# Patient Record
Sex: Female | Born: 1990 | Race: White | Hispanic: No | Marital: Married | Smoking: Never smoker
Health system: Southern US, Community
[De-identification: ages and names within clinical notes are randomized; demographics above are authoritative.]

## PROBLEM LIST (undated history)

## (undated) ENCOUNTER — Inpatient Hospital Stay (HOSPITAL_COMMUNITY): Payer: Self-pay

## (undated) DIAGNOSIS — Z789 Other specified health status: Secondary | ICD-10-CM

## (undated) HISTORY — PX: DILATION AND CURETTAGE OF UTERUS: SHX78

## (undated) HISTORY — PX: TONSILLECTOMY: SUR1361

---

## 2012-05-11 ENCOUNTER — Emergency Department (HOSPITAL_COMMUNITY): Payer: BC Managed Care – PPO

## 2012-05-11 ENCOUNTER — Emergency Department (HOSPITAL_COMMUNITY)
Admission: EM | Admit: 2012-05-11 | Discharge: 2012-05-11 | Disposition: A | Discharge status: Home / Self Care | Payer: BC Managed Care – PPO | Attending: Emergency Medicine | Admitting: Emergency Medicine

## 2012-05-11 ENCOUNTER — Encounter (HOSPITAL_COMMUNITY): Payer: Self-pay | Admitting: Emergency Medicine

## 2012-05-11 DIAGNOSIS — N760 Acute vaginitis: Secondary | ICD-10-CM | POA: Insufficient documentation

## 2012-05-11 DIAGNOSIS — O2390 Unspecified genitourinary tract infection in pregnancy, unspecified trimester: Secondary | ICD-10-CM | POA: Insufficient documentation

## 2012-05-11 DIAGNOSIS — A499 Bacterial infection, unspecified: Secondary | ICD-10-CM | POA: Insufficient documentation

## 2012-05-11 DIAGNOSIS — B9689 Other specified bacterial agents as the cause of diseases classified elsewhere: Secondary | ICD-10-CM | POA: Insufficient documentation

## 2012-05-11 DIAGNOSIS — IMO0002 Reserved for concepts with insufficient information to code with codable children: Secondary | ICD-10-CM

## 2012-05-11 LAB — URINE MICROSCOPIC-ADD ON

## 2012-05-11 LAB — URINALYSIS, ROUTINE W REFLEX MICROSCOPIC
Nitrite: NEGATIVE
Specific Gravity, Urine: 1.033 — ABNORMAL HIGH (ref 1.005–1.030)
Urobilinogen, UA: 0.2 mg/dL (ref 0.0–1.0)

## 2012-05-11 LAB — WET PREP, GENITAL: Trich, Wet Prep: NONE SEEN

## 2012-05-11 LAB — POCT PREGNANCY, URINE: Preg Test, Ur: POSITIVE — AB

## 2012-05-11 MED ORDER — METRONIDAZOLE 500 MG PO TABS: 500.0000 mg | ORAL_TABLET | Freq: Two times a day (BID) | ORAL | Status: AC

## 2012-05-11 NOTE — ED Provider Notes (Signed)
History     CSN: [id number]  Arrival date & time 05/11/12  1730   First MD Initiated Contact with Patient 05/11/12 1813      Chief Complaint  Patient presents with  . Vaginal Bleeding    (Consider location/radiation/quality/duration/timing/severity/associated sxs/prior treatment) Patient is a 21 y.o. female presenting with vaginal bleeding. The history is provided by the patient.  Vaginal Bleeding   patient with vaginal spotting x3 days. She is currently [redacted] weeks pregnant which is been confirmed by an ultrasound according to her. Denies any abdominal cramping or pain. No urinary symptoms. States that she is using one panty liner. No current problems with this pregnancy. She is a G1 P0. [facility] and sent here for further evaluation  History reviewed. No pertinent past medical history.  Past Surgical History  Procedure Date  . Tonsillectomy     No family history on file.  History  Substance Use Topics  . Smoking status: Never Smoker   . Smokeless tobacco: Not on file  . Alcohol Use: No    OB History    Grav Para Term Preterm Abortions TAB SAB Ect Mult Living   1               Review of Systems  Genitourinary: Positive for vaginal bleeding.  All other systems reviewed and are negative.    Allergies  Review of patient's allergies indicates no known allergies.  Home Medications   Current Outpatient Rx  Name Route Sig Dispense Refill  . PRENATAL MULTIVITAMIN CH Oral Take 1 tablet by mouth daily.      BP 121/72  Pulse 90  Temp(Src) 98.8 F (37.1 C) (Oral)  Resp 20  SpO2 100%  Physical Exam  Nursing note and vitals reviewed. Constitutional: She is oriented to person, place, and time. She appears well-developed and well-nourished.  Non-toxic appearance. No distress.  HENT:  Head: Normocephalic and atraumatic.  Eyes: Conjunctivae, EOM and lids are normal. Pupils are equal, round, and reactive to light.  Neck: Normal range of motion. Neck supple.  No tracheal deviation present. No mass present.  Cardiovascular: Normal rate, regular rhythm and normal heart sounds.  Exam reveals no gallop.   No murmur heard. Pulmonary/Chest: Effort normal and breath sounds normal. No stridor. No respiratory distress. She has no decreased breath sounds. She has no wheezes. She has no rhonchi. She has no rales.  Abdominal: Soft. Normal appearance and bowel sounds are normal. She exhibits no distension. There is no tenderness. There is no rebound and no CVA tenderness.  Genitourinary:       Cervical os closed, some bloody mucus  Musculoskeletal: Normal range of motion. She exhibits no edema and no tenderness.  Neurological: She is alert and oriented to person, place, and time. She has normal strength. No cranial nerve deficit or sensory deficit. GCS eye subscore is 4. GCS verbal subscore is 5. GCS motor subscore is 6.  Skin: Skin is warm and dry. No abrasion and no rash noted.  Psychiatric: She has a normal mood and affect. Her speech is normal and behavior is normal.    ED Course  Procedures (including critical care time)  Labs Reviewed  POCT PREGNANCY, URINE - Abnormal; Notable for the following:    Preg Test, Ur POSITIVE (*)    All other components within normal limits  URINALYSIS, ROUTINE W REFLEX MICROSCOPIC   No results found.   No diagnosis found.    MDM  Spoke with patient's OB doctor,  Dr. Gigi Gin, she'll be seen in the office tomorrow concerning for fetal demise. She also be treated for bacterial vaginosis        Toy Baker, MD 05/11/12 2122

## 2012-05-11 NOTE — Discharge Instructions (Signed)
Bacterial Vaginosis Bacterial vaginosis (BV) is a vaginal infection where the normal balance of bacteria in the vagina is disrupted. The normal balance is then replaced by an overgrowth of certain bacteria. There are several different kinds of bacteria that can cause BV. BV is the most common vaginal infection in women of childbearing age. CAUSES   The cause of BV is not fully understood. BV develops when there is an increase or imbalance of harmful bacteria.   Some activities or behaviors can upset the normal balance of bacteria in the vagina and put women at increased risk including:   Having a new sex partner or multiple sex partners.   Douching.   Using an intrauterine device (IUD) for contraception.   It is not clear what role sexual activity plays in the development of BV. However, women that have never had sexual intercourse are rarely infected with BV.  Women do not get BV from toilet seats, bedding, swimming pools or from touching objects around them.  SYMPTOMS   Grey vaginal discharge.   A fish-like odor with discharge, especially after sexual intercourse.   Itching or burning of the vagina and vulva.   Burning or pain with urination.   Some women have no signs or symptoms at all.  DIAGNOSIS  Your caregiver must examine the vagina for signs of BV. Your caregiver will perform lab tests and look at the sample of vaginal fluid through a microscope. They will look for bacteria and abnormal cells (clue cells), a pH test higher than 4.5, and a positive amine test all associated with BV.  RISKS AND COMPLICATIONS   Pelvic inflammatory disease (PID).   Infections following gynecology surgery.   Developing HIV.   Developing herpes virus.  TREATMENT  Sometimes BV will clear up without treatment. However, all women with symptoms of BV should be treated to avoid complications, especially if gynecology surgery is planned. Female partners generally do not need to be treated. However,  BV may spread between female sex partners so treatment is helpful in preventing a recurrence of BV.   BV may be treated with antibiotics. The antibiotics come in either pill or vaginal cream forms. Either can be used with nonpregnant or pregnant women, but the recommended dosages differ. These antibiotics are not harmful to the baby.   BV can recur after treatment. If this happens, a second round of antibiotics will often be prescribed.   Treatment is important for pregnant women. If not treated, BV can cause a premature delivery, especially for a pregnant woman who had a premature birth in the past. All pregnant women who have symptoms of BV should be checked and treated.   For chronic reoccurrence of BV, treatment with a type of prescribed gel vaginally twice a week is helpful.  HOME CARE INSTRUCTIONS   Finish all medication as directed by your caregiver.   Do not have sex until treatment is completed.   Tell your sexual partner that you have a vaginal infection. They should see their caregiver and be treated if they have problems, such as a mild rash or itching.   Practice safe sex. Use condoms. Only have 1 sex partner.  PREVENTION  Basic prevention steps can help reduce the risk of upsetting the natural balance of bacteria in the vagina and developing BV:  Do not have sexual intercourse (be abstinent).   Do not douche.   Use all of the medicine prescribed for treatment of BV, even if the signs and symptoms go away.     Tell your sex partner if you have BV. That way, they can be treated, if needed, to prevent reoccurrence.  SEEK MEDICAL CARE IF:   Your symptoms are not improving after 3 days of treatment.   You have increased discharge, pain, or fever.  MAKE SURE YOU:   Understand these instructions.   Will watch your condition.   Will get help right away if you are not doing well or get worse.  FOR MORE INFORMATION  Division of STD Prevention (DSTDP), Centers for Disease  Control and Prevention: SolutionApps.co.za American Social Health Association (ASHA): www.ashastd.org  Document Released: 11/26/2005 Document Revised: 11/15/2011 Document Reviewed: 05/19/2009 [facility] Patient Information 2012 [place], [place].Miscarriage An early pregnancy loss or spontaneous abortion (miscarriage) is a common problem. This usually happens when the pregnancy is not developing normally. It is very unlikely that you or your partner did anything to cause this, although cigarette smoking, a sexually transmitted disease, excessive alcohol use, or drug abuse can increase the risk. Other causes are:  Abnormalities of the uterus.   Hormone or medical problems.   Trauma or genetic (chromosome) problems.  Having a miscarriage does not change your chances of having a normal pregnancy in the future. Your caregiver will advise you when it is safe to try to get pregnant again. AFTER A MISCARRIAGE  A miscarriage is inevitable when there is continual, heavy vaginal bleeding; cramping; dilation of the cervix; or passing of any pregnancy tissue. Bleeding and cramping will usually continue until all the tissue has been removed from the womb (uterus).   Often the uterus does not clean itself out completely. A medication or a D&C procedure is needed to loosen or remove the pregnancy tissue from the uterus. A D&C scrapes or suctions the tissue out.   If you are RH negative, you may need to have Rh immune globulin to avoid Rh problems.   You may be given medication to fight an infection if the miscarriage was due to an infection.  HOME CARE INSTRUCTIONS   You should rest in bed for the next 2 to 3 days.   Do not take tub baths or put anything in your vagina, including tampons or a douche.   Do not have sex until your caregiver approves.   Avoid exercise or heavy activities until directed by your caregiver.   Save any vaginal discharge that looks like tissue. Ask your caregiver if he or she wants  to inspect the discharge.   If you and your partner are having problems with guilt or grieving, talk to your caregiver or get counseling to help you understand and cope with your pregnancy loss.   Allow enough time to grieve before trying to get pregnant again.  SEEK IMMEDIATE MEDICAL CARE IF:   You have persistent heavy bleeding or a bad smelling vaginal discharge.   You have continued abdominal or pelvic pain.   You have an oral temperature above 102 F (38.9 C), not controlled by medicine.   You have severe weakness, fainting, or keep throwing up (vomiting).   You develop chills.   You are experiencing domestic violence.  MAKE SURE YOU:   Understand these instructions.   Will watch your condition.   Will get help right away if you are not doing well or get worse.  Document Released: 01/03/2005 Document Revised: 11/15/2011 Document Reviewed: 11/18/2008 [facility] Patient Information 2012 [place], [place].  Followup with your [facility] doctor tomorrow. Call the office in the morning

## 2012-05-11 NOTE — ED Notes (Signed)
Pt presenting to ed with c/o seen at [facility] for vaginal spotting and was told to present to ed. Pt states she is [redacted] weeks pregnant. Pt denies lightheadedness and dizziness at this time. Pt is alert and oriented at this time. Pt states onset x 2 days.

## 2012-05-11 NOTE — ED Notes (Signed)
MD at bedside. 

## 2012-12-10 NOTE — L&D Delivery Note (Signed)
Pt had an amniotomy after admission. Given the amt of fluid, it is unlikely that she had SROM on admission. She had pit aug started. She quickly progressed along a normal labor curve. Second stage consisted of 6-7 pushes. She had a SVD of one live viable baby over intact perineum. Placenta-S/I. Left labial tear closed with 3-0 chromic. EBL-400cc. Baby to NBN.

## 2013-03-25 LAB — OB RESULTS CONSOLE GC/CHLAMYDIA: Chlamydia: NEGATIVE

## 2013-05-06 LAB — OB RESULTS CONSOLE VARICELLA ZOSTER ANTIBODY, IGG: Varicella: IMMUNE

## 2013-05-06 LAB — OB RESULTS CONSOLE HEPATITIS B SURFACE ANTIGEN: Hepatitis B Surface Ag: NEGATIVE

## 2013-05-06 LAB — OB RESULTS CONSOLE RUBELLA ANTIBODY, IGM: Rubella: IMMUNE

## 2013-05-12 LAB — OB RESULTS CONSOLE HGB/HCT, BLOOD
HCT: 41 %
Hemoglobin: 13 g/dL

## 2013-05-12 LAB — OB RESULTS CONSOLE ANTIBODY SCREEN: Antibody Screen: NEGATIVE

## 2013-05-12 LAB — OB RESULTS CONSOLE ABO/RH

## 2013-10-21 LAB — OB RESULTS CONSOLE GC/CHLAMYDIA: Gonorrhea: NEGATIVE

## 2013-11-03 ENCOUNTER — Inpatient Hospital Stay (HOSPITAL_COMMUNITY)
Admission: AD | Admit: 2013-11-03 | Discharge: 2013-11-03 | Disposition: A | Discharge status: Home / Self Care | Payer: 59 | Source: Ambulatory Visit | Attending: Obstetrics and Gynecology | Admitting: Obstetrics and Gynecology

## 2013-11-03 ENCOUNTER — Encounter (HOSPITAL_COMMUNITY): Payer: Self-pay

## 2013-11-03 DIAGNOSIS — O479 False labor, unspecified: Secondary | ICD-10-CM | POA: Insufficient documentation

## 2013-11-03 NOTE — MAU Note (Signed)
Patient was seen in the office today and fetal heart rate 108-110. Sent to MAU for NST. Patient states she is having irregular contractions. Denies bleeding or leaking. Was 2cm in the office today. Reports good fetal movement.

## 2013-11-04 ENCOUNTER — Inpatient Hospital Stay (HOSPITAL_COMMUNITY)
Admission: AD | Admit: 2013-11-04 | Discharge: 2013-11-05 | Disposition: A | Discharge status: Home / Self Care | Payer: 59 | Source: Ambulatory Visit | Attending: Obstetrics and Gynecology | Admitting: Obstetrics and Gynecology

## 2013-11-04 ENCOUNTER — Encounter (HOSPITAL_COMMUNITY): Payer: Self-pay | Admitting: *Deleted

## 2013-11-04 DIAGNOSIS — O479 False labor, unspecified: Secondary | ICD-10-CM | POA: Insufficient documentation

## 2013-11-04 NOTE — MAU Note (Signed)
Pt. Here for contractions that began tonight at 8pm. Contracting every 4 mins. Denies any leakage of fluid, was spotting earlier today. Did have some mucous discharge earlier as well. Baby is moving well per pt. Did have an appointment yesterday and was 2 cm at that time. Next appointment is next Wednesday.

## 2013-11-04 NOTE — MAU Note (Signed)
Contractions tonight. Denies VB or LOF. Positive fetal movement. Denies complications with pregnancy. Was dilated 2 cm yesterday.

## 2013-11-06 ENCOUNTER — Inpatient Hospital Stay (HOSPITAL_COMMUNITY)
Admission: AD | Admit: 2013-11-06 | Discharge: 2013-11-08 | DRG: 775 | Disposition: A | Discharge status: Home / Self Care | Payer: 59 | Source: Ambulatory Visit | Attending: Obstetrics and Gynecology | Admitting: Obstetrics and Gynecology

## 2013-11-06 ENCOUNTER — Encounter (HOSPITAL_COMMUNITY): Payer: Self-pay | Admitting: *Deleted

## 2013-11-06 DIAGNOSIS — O4290 Premature rupture of membranes, unspecified as to length of time between rupture and onset of labor, unspecified weeks of gestation: Principal | ICD-10-CM | POA: Diagnosis present

## 2013-11-06 DIAGNOSIS — Z3480 Encounter for supervision of other normal pregnancy, unspecified trimester: Secondary | ICD-10-CM

## 2013-11-06 DIAGNOSIS — E669 Obesity, unspecified: Secondary | ICD-10-CM | POA: Diagnosis present

## 2013-11-06 DIAGNOSIS — IMO0001 Reserved for inherently not codable concepts without codable children: Secondary | ICD-10-CM

## 2013-11-06 HISTORY — DX: Other specified health status: Z78.9

## 2013-11-06 LAB — CBC
HCT: 38.4 % (ref 36.0–46.0)
Hemoglobin: 13.1 g/dL (ref 12.0–15.0)
MCH: 29.2 pg (ref 26.0–34.0)
MCHC: 34.1 g/dL (ref 30.0–36.0)
MCV: 85.7 fL (ref 78.0–100.0)
RBC: 4.48 MIL/uL (ref 3.87–5.11)

## 2013-11-06 MED ORDER — PHENYLEPHRINE 40 MCG/ML (10ML) SYRINGE FOR IV PUSH (FOR BLOOD PRESSURE SUPPORT)
80.0000 ug | PREFILLED_SYRINGE | INTRAVENOUS | Status: DC | PRN
  Filled 2013-11-06: qty 2
  Filled 2013-11-06: qty 10

## 2013-11-06 MED ORDER — EPHEDRINE 5 MG/ML INJ
10.0000 mg | INTRAVENOUS | Status: DC | PRN
  Filled 2013-11-06: qty 2

## 2013-11-06 MED ORDER — PHENYLEPHRINE 40 MCG/ML (10ML) SYRINGE FOR IV PUSH (FOR BLOOD PRESSURE SUPPORT)
80.0000 ug | PREFILLED_SYRINGE | INTRAVENOUS | Status: DC | PRN
  Filled 2013-11-06: qty 10
  Filled 2013-11-06: qty 2

## 2013-11-06 MED ORDER — BUTORPHANOL TARTRATE 1 MG/ML IJ SOLN
1.0000 mg | INTRAMUSCULAR | Status: DC | PRN
  Administered 2013-11-07: 1 mg via INTRAVENOUS
  Filled 2013-11-06: qty 1

## 2013-11-06 MED ORDER — IBUPROFEN 600 MG PO TABS: 600.0000 mg | ORAL_TABLET | Freq: Four times a day (QID) | ORAL | Status: DC | PRN

## 2013-11-06 MED ORDER — DIPHENHYDRAMINE HCL 50 MG/ML IJ SOLN: 12.5000 mg | INTRAMUSCULAR | Status: DC | PRN

## 2013-11-06 MED ORDER — TERBUTALINE SULFATE 1 MG/ML IJ SOLN: 0.2500 mg | Freq: Once | INTRAMUSCULAR | Status: AC | PRN

## 2013-11-06 MED ORDER — CITRIC ACID-SODIUM CITRATE 334-500 MG/5ML PO SOLN: 30.0000 mL | ORAL | Status: DC | PRN

## 2013-11-06 MED ORDER — LACTATED RINGERS IV SOLN
INTRAVENOUS | Status: DC
  Administered 2013-11-06 – 2013-11-07 (×3): via INTRAVENOUS

## 2013-11-06 MED ORDER — OXYTOCIN BOLUS FROM INFUSION
500.0000 mL | INTRAVENOUS | Status: DC
  Administered 2013-11-07: 500 mL via INTRAVENOUS

## 2013-11-06 MED ORDER — LIDOCAINE HCL (PF) 1 % IJ SOLN
30.0000 mL | INTRAMUSCULAR | Status: DC | PRN
  Administered 2013-11-07: 30 mL via SUBCUTANEOUS
  Filled 2013-11-06 (×2): qty 30

## 2013-11-06 MED ORDER — OXYTOCIN 40 UNITS IN LACTATED RINGERS INFUSION - SIMPLE MED
62.5000 mL/h | INTRAVENOUS | Status: DC
  Filled 2013-11-06: qty 1000

## 2013-11-06 MED ORDER — OXYCODONE-ACETAMINOPHEN 5-325 MG PO TABS: 1.0000 | ORAL_TABLET | ORAL | Status: DC | PRN

## 2013-11-06 MED ORDER — ONDANSETRON HCL 4 MG/2ML IJ SOLN: 4.0000 mg | Freq: Four times a day (QID) | INTRAMUSCULAR | Status: DC | PRN

## 2013-11-06 MED ORDER — ACETAMINOPHEN 325 MG PO TABS: 650.0000 mg | ORAL_TABLET | ORAL | Status: DC | PRN

## 2013-11-06 MED ORDER — LACTATED RINGERS IV SOLN: 500.0000 mL | INTRAVENOUS | Status: DC | PRN

## 2013-11-06 MED ORDER — EPHEDRINE 5 MG/ML INJ
10.0000 mg | INTRAVENOUS | Status: DC | PRN
  Filled 2013-11-06: qty 4
  Filled 2013-11-06: qty 2

## 2013-11-06 MED ORDER — LACTATED RINGERS IV SOLN
500.0000 mL | Freq: Once | INTRAVENOUS | Status: AC
  Administered 2013-11-07: 1000 mL via INTRAVENOUS

## 2013-11-06 MED ORDER — OXYTOCIN 40 UNITS IN LACTATED RINGERS INFUSION - SIMPLE MED
1.0000 m[IU]/min | INTRAVENOUS | Status: DC
  Administered 2013-11-06: 2 m[IU]/min via INTRAVENOUS

## 2013-11-06 MED ORDER — FLEET ENEMA 7-19 GM/118ML RE ENEM: 1.0000 | ENEMA | RECTAL | Status: DC | PRN

## 2013-11-06 MED ORDER — FENTANYL 2.5 MCG/ML BUPIVACAINE 1/10 % EPIDURAL INFUSION (WH - ANES)
14.0000 mL/h | INTRAMUSCULAR | Status: DC | PRN
  Administered 2013-11-07: 14 mL/h via EPIDURAL
  Filled 2013-11-06: qty 125

## 2013-11-06 NOTE — H&P (Signed)
Pt is a 22 yr old white female G2P0 at term who presents to the ER with SROM. PNC was complicated by morbid obesity.  PMHX: see hollister PE: VSSAF         HEENT-wnl         ABD-gravid         Pelvic- Cx-70/2/-3 vtx IMP/ IUP at term with SROM Plan/ Admit.

## 2013-11-06 NOTE — MAU Note (Signed)
Pt reports she noticed vaginal bleeding yesterday. It stopped and restarted today. Reports occasional mild ctx and good fetal movement.

## 2013-11-06 NOTE — Progress Notes (Signed)
Dareen Piano, MD, notified of SVE, UC activity and SROM at 0100 verified by positive amnisure. Orders received for 1 mg stadol every 2 hours prn, epidural, and continue to monitor at this time. POC to be reevaluated at 2200 and pitocin may be started.

## 2013-11-06 NOTE — MAU Provider Note (Signed)
Chief Complaint:  Vaginal Bleeding   First Provider Initiated Contact with Patient 11/06/13 1645     HPI: Deanna Avila is a 22 y.o. G1P0 at [redacted]w[redacted]d who presents to maternity admissions reporting leaking  And mild contractions. Denies vaginal bleeding. Good fetal movement.    Past Medical History: Past Medical History  Diagnosis Date  . Medical history non-contributory     Past obstetric history: OB History  Gravida Para Term Preterm AB SAB TAB Ectopic Multiple Living  1             # Outcome Date GA Lbr Len/2nd Weight Sex Delivery Anes PTL Lv  1 CUR               Past Surgical History: Past Surgical History  Procedure Laterality Date  . Tonsillectomy       Family History: Family History  Problem Relation Age of Onset  . Diabetes Maternal Grandfather   . Heart disease Maternal Grandfather   . Diabetes Paternal Grandfather   . Heart disease Paternal Grandfather     Social History: History  Substance Use Topics  . Smoking status: Never Smoker   . Smokeless tobacco: Not on file  . Alcohol Use: No    Allergies: No Known Allergies  Meds:  Prescriptions prior to admission  Medication Sig Dispense Refill  . acetaminophen (TYLENOL) 325 MG tablet Take 650 mg by mouth every 6 (six) hours as needed for moderate pain.      . calcium carbonate (TUMS - DOSED IN MG ELEMENTAL CALCIUM) 500 MG chewable tablet Chew 2 tablets by mouth 3 (three) times daily as needed for indigestion or heartburn.      . Docosahexaenoic Acid (PRENATAL DHA PO) Take 1 tablet by mouth daily.        ROS: Pertinent findings in history of present illness.  Physical Exam  Blood pressure 121/67, pulse 91, temperature 98.5 F (36.9 C), temperature source Oral, resp. rate 18, height 5\' 3"  (1.6 m). GENERAL: Well-developed, well-nourished female in no acute distress.  HEENT: normocephalic HEART: normal rate RESP: normal effort ABDOMEN: Soft, non-tender, gravid appropriate for gestational  age EXTREMITIES: Nontender, no edema NEURO: alert and oriented SPECULUM EXAM: NEFG w/ small amount of clear fluid leaking from vagina, small amount of clear fluid mixed w/ mucus. Unsure if pos pooling, no blood, cervix clean Dilation: 2 Effacement (%): 60 Station: -2 Presentation: Vertex Exam by:: V,Vernie Piet,CNM  FHT:  Baseline 130 , moderate variability, accelerations present, no decelerations Contractions: irreg, painless   Labs: Fern equivocal. Amnisure pos.  Assessment: PROM  Plan: Admit to L&D per Dr. Dareen Piano. Assuming care of pt.   Luna, CNM 11/06/2013 4:44 PM

## 2013-11-07 ENCOUNTER — Inpatient Hospital Stay (HOSPITAL_COMMUNITY): Payer: 59 | Admitting: Anesthesiology

## 2013-11-07 ENCOUNTER — Encounter (HOSPITAL_COMMUNITY): Payer: 59 | Admitting: Anesthesiology

## 2013-11-07 ENCOUNTER — Encounter (HOSPITAL_COMMUNITY): Payer: Self-pay | Admitting: General Practice

## 2013-11-07 DIAGNOSIS — Z3480 Encounter for supervision of other normal pregnancy, unspecified trimester: Secondary | ICD-10-CM

## 2013-11-07 MED ORDER — BENZOCAINE-MENTHOL 20-0.5 % EX AERO: 1.0000 "application " | INHALATION_SPRAY | CUTANEOUS | Status: DC | PRN

## 2013-11-07 MED ORDER — WITCH HAZEL-GLYCERIN EX PADS: 1.0000 "application " | MEDICATED_PAD | CUTANEOUS | Status: DC | PRN

## 2013-11-07 MED ORDER — DIBUCAINE 1 % RE OINT: 1.0000 "application " | TOPICAL_OINTMENT | RECTAL | Status: DC | PRN

## 2013-11-07 MED ORDER — SODIUM BICARBONATE 8.4 % IV SOLN
INTRAVENOUS | Status: DC | PRN
  Administered 2013-11-07: 5 mL via EPIDURAL
  Administered 2013-11-07: 7 mL via EPIDURAL

## 2013-11-07 MED ORDER — MEASLES, MUMPS & RUBELLA VAC ~~LOC~~ INJ
0.5000 mL | INJECTION | Freq: Once | SUBCUTANEOUS | Status: DC
  Filled 2013-11-07: qty 0.5

## 2013-11-07 MED ORDER — ONDANSETRON HCL 4 MG/2ML IJ SOLN: 4.0000 mg | INTRAMUSCULAR | Status: DC | PRN

## 2013-11-07 MED ORDER — ZOLPIDEM TARTRATE 5 MG PO TABS: 5.0000 mg | ORAL_TABLET | Freq: Every evening | ORAL | Status: DC | PRN

## 2013-11-07 MED ORDER — TETANUS-DIPHTH-ACELL PERTUSSIS 5-2.5-18.5 LF-MCG/0.5 IM SUSP
0.5000 mL | Freq: Once | INTRAMUSCULAR | Status: AC
  Administered 2013-11-07: 0.5 mL via INTRAMUSCULAR
  Filled 2013-11-07: qty 0.5

## 2013-11-07 MED ORDER — SENNOSIDES-DOCUSATE SODIUM 8.6-50 MG PO TABS
2.0000 | ORAL_TABLET | ORAL | Status: DC
  Administered 2013-11-07: 2 via ORAL
  Filled 2013-11-07: qty 2

## 2013-11-07 MED ORDER — OXYCODONE-ACETAMINOPHEN 5-325 MG PO TABS: 1.0000 | ORAL_TABLET | ORAL | Status: DC | PRN

## 2013-11-07 MED ORDER — SIMETHICONE 80 MG PO CHEW: 80.0000 mg | CHEWABLE_TABLET | ORAL | Status: DC | PRN

## 2013-11-07 MED ORDER — IBUPROFEN 600 MG PO TABS
600.0000 mg | ORAL_TABLET | Freq: Four times a day (QID) | ORAL | Status: DC
  Administered 2013-11-07 – 2013-11-08 (×4): 600 mg via ORAL
  Filled 2013-11-07 (×5): qty 1

## 2013-11-07 MED ORDER — ONDANSETRON HCL 4 MG PO TABS: 4.0000 mg | ORAL_TABLET | ORAL | Status: DC | PRN

## 2013-11-07 NOTE — Anesthesia Preprocedure Evaluation (Addendum)
Anesthesia Evaluation  Patient identified by MRN, date of birth, ID band Patient awake    Reviewed: Allergy & Precautions, H&P , Patient's Chart, lab work & pertinent test results  Airway Mallampati: IV  TM Distance: >3 FB Neck ROM: full    Dental  (+) Teeth Intact   Pulmonary  breath sounds clear to auscultation        Cardiovascular Rhythm:regular Rate:Normal     Neuro/Psych    GI/Hepatic   Endo/Other  Morbid obesity  Renal/GU      Musculoskeletal   Abdominal   Peds  Hematology   Anesthesia Other Findings       Reproductive/Obstetrics (+) Pregnancy                            Anesthesia Physical Anesthesia Plan  ASA: III  Anesthesia Plan: Epidural   Post-op Pain Management:    Induction:   Airway Management Planned:   Additional Equipment:   Intra-op Plan:   Post-operative Plan:   Informed Consent: I have reviewed the patients History and Physical, chart, labs and discussed the procedure including the risks, benefits and alternatives for the proposed anesthesia with the patient or authorized representative who has indicated his/her understanding and acceptance.   Dental Advisory Given  Plan Discussed with:   Anesthesia Plan Comments: (Labs checked- platelets confirmed with RN in room. Fetal heart tracing, per RN, reported to be stable enough for sitting procedure. Discussed epidural, and patient consents to the procedure:  included risk of possible headache,backache, failed block, allergic reaction, and nerve injury. This patient was asked if she had any questions or concerns before the procedure started.)        Anesthesia Quick Evaluation  

## 2013-11-07 NOTE — Lactation Note (Signed)
This note was copied from the chart of Deanna Arcadia Gorgas. Lactation Consultation Note Initial visit at 16 hours of age.  Mom reports needing nipple shield to get baby latched because her nipples are flat and hand pump isn't effective.  Baby just finished breast feeding for 10 minutes, Mom heard swallows and could see colostrum in nipple shield. Baby skin to skin and a little fussy.  Offered to assist with latch now, mom declines she says baby is gassy.  Encouraged to feed with cues skin to skin.  Mom says she knows how to hand express colostrum.  Discussed basics and cluster feeding.  [facility] LC resources given and discussed.  Referred to baby and me book for additional information.   Patient Name: Deanna Avila [id number] Date: 11/07/2013 Reason for consult: Initial assessment   Maternal Data Formula Feeding for Exclusion: No Infant to breast within first hour of birth: Yes Has patient been taught Hand Expression?: Yes Does the patient have breastfeeding experience prior to this delivery?: No  Feeding Feeding Type: Breast Fed Length of feed: 10 min  LATCH Score/Interventions Latch: Repeated attempts needed to sustain latch, nipple held in mouth throughout feeding, stimulation needed to elicit sucking reflex. Intervention(s): Adjust position;Assist with latch;Breast compression  Audible Swallowing: A few with stimulation Intervention(s): Skin to skin  Type of Nipple: Flat Intervention(s):  (nipple shield left)  Comfort (Breast/Nipple): Soft / non-tender     Hold (Positioning): Assistance needed to correctly position infant at breast and maintain latch.  LATCH Score: 6  Lactation Tools Discussed/Used     Consult Status Consult Status: Follow-up Date: 11/08/13 Follow-up type: In-patient    Jannifer Rodney 11/07/2013, 10:01 PM

## 2013-11-07 NOTE — Anesthesia Postprocedure Evaluation (Signed)
Anesthesia Post Note  Patient: Deanna Avila  Procedure(s) Performed: * No procedures listed *  Anesthesia type: Epidural  Patient location: Mother/Baby  Post pain: Pain level controlled  Post assessment: Post-op Vital signs reviewed  Last Vitals: BP 113/71  Pulse 93  Temp(Src) 37.1 C (Oral)  Resp 18  Ht 5\' 3"  (1.6 m)  Wt 281 lb (127.461 kg)  BMI 49.79 kg/m2  SpO2 97%  Post vital signs: Reviewed  Level of consciousness: awake  Complications: No apparent anesthesia complications

## 2013-11-07 NOTE — Anesthesia Procedure Notes (Signed)
Epidural Patient location during procedure: OB  Preanesthetic Checklist Completed: patient identified, site marked, surgical consent, pre-op evaluation, timeout performed, IV checked, risks and benefits discussed and monitors and equipment checked  Epidural Patient position: sitting Prep: site prepped and draped and DuraPrep Patient monitoring: continuous pulse ox and blood pressure Approach: midline Injection technique: LOR air  Needle:  Needle type: Tuohy  Needle gauge: 17 G Needle length: 9 cm and 9 Needle insertion depth: 12 cm Catheter type: closed end flexible Catheter size: 19 Gauge Catheter at skin depth: 20 cm Test dose: negative  Assessment Events: blood not aspirated, injection not painful, no injection resistance, negative IV test and no paresthesia  Additional Notes Attempted 1st at L3-4 then switch to L4-5. Very difficult placement due to obesity obscured landmarks Dosing of Epidural:  1st dose, through catheter ............................................Marland Kitchen epi 1:200K + Xylocaine 40 mg  2nd dose, through catheter, after waiting 3 minutes...Marland KitchenMarland Kitchenepi 1:200K + Xylocaine 60 mg    ( 2% Xylo charted as a single dose in Epic Meds for ease of charting; actual dosing was fractionated as above, for saftey's sake)  As each dose occurred, patient was free of IV sx; and patient exhibited no evidence of SA injection.  Patient is more comfortable after epidural dosed. Please see RN's note for documentation of vital signs,and FHR which are stable.  Patient reminded not to try to ambulate with numb legs, and that an RN must be present when she attempts to get up.

## 2013-11-08 LAB — CBC
MCHC: 33.2 g/dL (ref 30.0–36.0)
MCV: 86.7 fL (ref 78.0–100.0)
RDW: 14.5 % (ref 11.5–15.5)

## 2013-11-08 NOTE — Lactation Note (Signed)
This note was copied from the chart of Deanna Tehila Sokolow. Lactation Consultation NoteCalled to assess feeding. Mom had baby latched to breast and reports that he had already been nursing for 5 minutes. Baby going to sleep- stimulated and then comes off the breast. Several more attempts to latch- he is on and off the breast. Discussed making sure he is getting Colostrum. Has DEBP at home. Encouraged to pump after feedings and feed all EBM to him by dropper- Discussed options and mom decided to use dropper. No further questions at present. To call prn  Patient Name: Deanna Avila [id number] Date: 11/08/2013 Reason for consult: Follow-up assessment   Maternal Data Formula Feeding for Exclusion: No Infant to breast within first hour of birth: Yes Does the patient have breastfeeding experience prior to this delivery?: No  Feeding Feeding Type: Breast Fed Length of feed: 10 min  LATCH Score/Interventions Latch: Repeated attempts needed to sustain latch, nipple held in mouth throughout feeding, stimulation needed to elicit sucking reflex.  Audible Swallowing: A few with stimulation  Type of Nipple: Flat  Comfort (Breast/Nipple): Soft / non-tender     Hold (Positioning): Assistance needed to correctly position infant at breast and maintain latch. Intervention(s): Breastfeeding basics reviewed;Support Pillows  LATCH Score: 6  Lactation Tools Discussed/Used Tools: Nipple Shields Pump Review: Setup, frequency, and cleaning   Consult Status Consult Status: Complete    Pamelia Hoit 11/08/2013, 1:06 PM

## 2013-11-08 NOTE — Discharge Summary (Signed)
Obstetric Discharge Summary Reason for Admission: labor Prenatal Procedures: ultrasound Intrapartum Procedures: spontaneous vaginal delivery Postpartum Procedures: none Complications-Operative and Postpartum: none Hemoglobin  Date Value Range Status  11/08/2013 11.7* 12.0 - 15.0 g/dL Final  4/0/9811 91.4   Final     HCT  Date Value Range Status  11/08/2013 35.2* 36.0 - 46.0 % Final  05/12/2013 41   Final    Physical Exam:  General: alert Lochia: appropriate Uterine Fundus: firm   Discharge Diagnoses: Term Pregnancy-delivered  Discharge Information: Date: 11/08/2013 Activity: pelvic rest Diet: routine Medications: PNV and Ibuprofen Condition: stable Instructions: refer to practice specific booklet Discharge to: home   Newborn Data: Live born female  Birth Weight: 8 lb 6.2 oz (3805 g) APGAR: 7, 9  Home with mother.  Quiera Diffee E 11/08/2013, 12:47 PM

## 2013-11-08 NOTE — Progress Notes (Signed)
PPD#1 Pt without complaints. Lochia - wnl IMP/ doing well Plan/ routine care.

## 2013-11-08 NOTE — Lactation Note (Addendum)
This note was copied from the chart of Deanna Shewanda Sharpe. Lactation Consultation Note: Mom reports that baby last fed for 15 minutes about 30 minutes ago. He is asleep on her chest. Reports that breast feeding is going much better with NS. Reports that she does see Colostrum in NS after nursing. Baby was circ'd this morning. For DC today. Encouraged to call for assist if baby wakes before DC. OP appointment made for Wed 12/3 at 2:30 pm. Mom has Medela PIS at home. Encouraged to pump 4-5 times/day to stimulation milk supply. Baby has had 2 voids and 2 stools since midnight. No questions at present. To call prn  Patient Name: Deanna Avila [id number] Date: 11/08/2013 Reason for consult: Follow-up assessment   Maternal Data Formula Feeding for Exclusion: No Infant to breast within first hour of birth: Yes Does the patient have breastfeeding experience prior to this delivery?: No  Feeding Length of feed: 15 min  LATCH Score/Interventions   Lactation Tools Discussed/Used     Consult Status Consult Status: PRN    Pamelia Hoit 11/08/2013, 12:10 PM

## 2013-11-09 LAB — TYPE AND SCREEN
DAT, IgG: NEGATIVE
Unit division: 0
Unit division: 0

## 2013-11-11 ENCOUNTER — Ambulatory Visit (HOSPITAL_COMMUNITY)
Admit: 2013-11-11 | Discharge: 2013-11-11 | Disposition: A | Discharge status: Home / Self Care | Payer: 59 | Attending: Obstetrics and Gynecology | Admitting: Obstetrics and Gynecology

## 2013-11-11 NOTE — Lactation Note (Signed)
Adult Lactation Consultation Outpatient Visit Note  Patient Name: Deanna Avila(mother)   BABY: [name] Date of Birth: 1991/08/22                               DOB: [birth year] Gestational Age at Delivery: [redacted]w[redacted]d             BIRTH WEIGHT: 8-6.2 Type of Delivery: NVD                                         DISCHARGE WEIGHT:                                                                      WEIGHT TODAY: 7-11.4 Breastfeeding History: Frequency of Breastfeeding: EVERY 2-2.5 HOURS Length of Feeding: 20-30 MINUTES Voids: 4 Stools: 4 YELLOW-BROWN  Supplementing / Method:NONE Pumping:  Type of Pump:MEDELA PUMP IN STYLE   Frequency:NONE  Volume:    Comments:    Consultation Evaluation:Mom and 4 day old baby here for feeding assessment.  Baby had difficult latch and nipple shield was started in the hospital.  Mom's milk cam in this AM and breasts are very full but not engorged.  Observed baby latch easily to left breast using a 20 mm nipple shield.  Baby latched easily and nursed actively with many audible swallows.  Baby pulled off after 15 minutes content.  A 24 mm nipple shield was also tried and latch was slightly wider.  Mom will use the shield most comfortable and latch is deepest.  Reviewed basics and answered questions.  Encouraged to call prn.  Initial Feeding Assessment:15 MINUTES LEFT BREAST  Pre-feed HQIONG:2952 Post-feed WUXLKG:4010 Amount Transferred:26 MLS Comments:  Additional Feeding Assessment: Pre-feed Weight: Post-feed Weight: Amount Transferred: Comments:  Additional Feeding Assessment: Pre-feed Weight: Post-feed Weight: Amount Transferred: Comments:  Total Breast milk Transferred this Visit: 26 MLS Total Supplement Given: NONE  Additional Interventions:   Follow-Up PEDI TODAY, SMART START TOMORROW.  WILL TRY TO ATTEND BF SUPPORT GROUP      Hansel Feinstein 11/11/2013, 3:01 PM

## 2014-01-27 ENCOUNTER — Ambulatory Visit (HOSPITAL_COMMUNITY): Admission: RE | Admit: 2014-01-27 | Payer: 59 | Source: Ambulatory Visit

## 2014-02-03 ENCOUNTER — Ambulatory Visit (HOSPITAL_COMMUNITY)
Admission: RE | Admit: 2014-02-03 | Discharge: 2014-02-03 | Disposition: A | Discharge status: Home / Self Care | Payer: 59 | Source: Ambulatory Visit | Attending: Obstetrics and Gynecology | Admitting: Obstetrics and Gynecology

## 2014-02-03 NOTE — Lactation Note (Signed)
Adult Lactation Consultation Outpatient Visit Note  Patient Name: Deanna Avila                                 ZOX[id number][birth year] Date of Birth: 12/16/1990                                              BW: 8-6 Gestational Age at Delivery: Unknown                        Todays weight: [id number],5042 Type of Delivery:  Vaginal del                                      12 weeks 4 days today  Breastfeeding History: Frequency of Breastfeeding: every 2-3 hours Length of Feeding:  Voids: QS Stools: QS  Supplementing / Method: Only supplements with bottle while mother is at work. Infant takes 6 ounces when bottle feed. Pumping:  Type of Pump:Medela Pump N Style   Frequency:3 times daily for 20 mins  Volume:  2-4 ounces  Comments: Mother has been sore for about 3 weeks. She states that she became sore after she stopped using the nipple shield.  She is also concerned about milk volume.    Consultation Evaluation: Mother is complaining of sore nipples since she stopped using the nipple shield. Observed very pink nipple tissue on the (R). (L)  nipple is only slightly pink . Mothers breast are full. infant sustained latch for 20 mins.    Observed slight pinching on (R) when infant released the breast. Assist with latch and proper positioning. Mother has been describing a pinching pain on nipple for about 30 seconds into the feeding. Encouraged breast compression.   Recommend that mother have Peds to evaluate infants tongue for good mobility.  Initial Feeding Assessment: Pre-feed Weight:5042 Post-feed Weight:5080 Amount Transferred:38 ml Comments:  Additional Feeding Assessment: Pre-feed Weight:5080 Post-feed BJYNWG:9562Weight:5116 Amount Transferred:36 ml Comments:    Total Breast milk Transferred this Visit: 74 ml Total Supplement Given:   Additional Interventions: Recommend that mother phone her OB for RX for APNO Have infant to open mouth wide for latch on, use good firm support, then rotate positions  frequently Mother do good breast massage, hand express .  Advised mother to drain breast well at least once daily with electric pump for 20 mins Mother may supplement infant with EBM as needed with method of choice. Follow up for feeding assessment in one week     Follow-Up  March 4 at 10:30     Stevan BornKendrick, [name] 02/03/2014, 10:48 AM

## 2014-02-10 ENCOUNTER — Ambulatory Visit (HOSPITAL_COMMUNITY)
Admission: RE | Admit: 2014-02-10 | Discharge: 2014-02-10 | Disposition: A | Discharge status: Home / Self Care | Payer: 59 | Source: Ambulatory Visit | Attending: Obstetrics and Gynecology | Admitting: Obstetrics and Gynecology

## 2014-02-10 NOTE — Lactation Note (Signed)
Adult Lactation Consultation Outpatient Visit Note: Follow up visit for sore nipples and milk supply concerns. Mom reports that nipples are much better since last week. Has been using APNC on them since last week. Reports that she has been taking Fenugreek since last week- feels as slight increase in milk supply.    Patient Name: Runell GessKimberly Noffke Date of Birth: 11/05/1991 Gestational Age at Delivery: Unknown Type of Delivery:   Breastfeeding History: Frequency of Breastfeeding: q 3-4 hours while mom is at home, Bottle feeds EBM while mom is at work. Length of Feeding: 30 min Voids: QS- had void while here for appointment  Stools; QS  Supplementing / Method: Pumping:  Type of Pump: Medela PIS   Frequency: pumps 3 times at work and pumps after feedings while home with Samuel GermanyGage about 3 times/day  Volume:  4-6 oz  Comments:    Consultation Evaluation:  Initial Feeding Assessment: Pre-feed Weight: 11- 2.3  5056g Post-feed Weight: 11- 4.6  5120g Amount Transferred: 64 cc's Comments: Gage latched well to left breast in cradle position. Mom very comfortable latching him on. Reports that she used a NS the first 9 Mardella Nuckles of life but has been latching without it since then. Nursed for 15 minutes, then very wiggly and fussy. Burped then weighed.  Additional Feeding Assessment: Pre-feed Weight: 11- 4.6  5120g Post-feed Weight: 11- 6.4  5170 Amount Transferred: 50 Comments: Gage latched well to left breast in cradle hold. Nursed for 10 minutes then wiggly. No more swallows noted. Seems content after nursing- awake and looking all around. Mom reports that he takes 5-6 oz from bottle at a feeding. No further questions at present. To call prn   Total Breast milk Transferred this Visit: 114 cc's Total Supplement Given: 0  Additional Interventions:  Continue frequent cue based feedings. Continue pumping after feedings to increase milk supply.  Continue Fengreek as needed Continue APNC as needed.    Follow-Up  With Ped B[facility] To call us prn if feels like she needs another appointment     Audry RilesWeeks, Georgiann Neider D 02/10/2014, 11:16 AM

## 2014-10-11 ENCOUNTER — Encounter (HOSPITAL_COMMUNITY): Payer: Self-pay | Admitting: General Practice

## 2015-06-20 ENCOUNTER — Emergency Department (HOSPITAL_COMMUNITY): Payer: Self-pay

## 2015-06-20 ENCOUNTER — Emergency Department (HOSPITAL_COMMUNITY)
Admission: EM | Admit: 2015-06-20 | Discharge: 2015-06-20 | Disposition: A | Discharge status: Home / Self Care | Payer: Self-pay | Attending: Emergency Medicine | Admitting: Emergency Medicine

## 2015-06-20 DIAGNOSIS — Y92410 Unspecified street and highway as the place of occurrence of the external cause: Secondary | ICD-10-CM | POA: Insufficient documentation

## 2015-06-20 DIAGNOSIS — Y9389 Activity, other specified: Secondary | ICD-10-CM | POA: Insufficient documentation

## 2015-06-20 DIAGNOSIS — S0990XA Unspecified injury of head, initial encounter: Secondary | ICD-10-CM | POA: Insufficient documentation

## 2015-06-20 DIAGNOSIS — Y998 Other external cause status: Secondary | ICD-10-CM | POA: Insufficient documentation

## 2015-06-20 DIAGNOSIS — S161XXA Strain of muscle, fascia and tendon at neck level, initial encounter: Secondary | ICD-10-CM | POA: Insufficient documentation

## 2015-06-20 MED ORDER — OXYCODONE-ACETAMINOPHEN 5-325 MG PO TABS
1.0000 | ORAL_TABLET | Freq: Once | ORAL | Status: AC
Start: 2015-06-20 — End: 2015-06-20
  Administered 2015-06-20: 1 via ORAL

## 2015-06-20 MED ORDER — NAPROXEN 500 MG PO TABS
500.0000 mg | ORAL_TABLET | Freq: Two times a day (BID) | ORAL | Status: DC
Start: 2015-06-20 — End: 2016-03-02

## 2015-06-20 MED ORDER — OXYCODONE-ACETAMINOPHEN 5-325 MG PO TABS
ORAL_TABLET | ORAL | Status: AC
  Filled 2015-06-20: qty 1

## 2015-06-20 MED ORDER — METHOCARBAMOL 500 MG PO TABS: 500.0000 mg | ORAL_TABLET | Freq: Two times a day (BID) | ORAL | Status: DC

## 2015-06-20 MED ORDER — METHOCARBAMOL 500 MG PO TABS
500.0000 mg | ORAL_TABLET | Freq: Once | ORAL | Status: AC
  Administered 2015-06-20: 500 mg via ORAL
  Filled 2015-06-20: qty 1
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
## 2015-06-20 NOTE — Discharge Instructions (Signed)
1. Medications: robaxin, naproxyn, usual home medications 2. Treatment: rest, drink plenty of fluids, gentle stretching as discussed, alternate ice and heat 3. Follow Up: Please followup with your primary doctor in 3 days for discussion of your diagnoses and further evaluation after today's visit; if you do not have a primary care doctor use the resource guide provided to find one;  Return to the ER for worsening back pain, difficulty walking, loss of bowel or bladder control or other concerning symptoms   Cervical Sprain A cervical sprain is an injury in the neck in which the strong, fibrous tissues (ligaments) that connect your neck bones stretch or tear. Cervical sprains can range from mild to severe. Severe cervical sprains can cause the neck vertebrae to be unstable. This can lead to damage of the spinal cord and can result in serious nervous system problems. The amount of time it takes for a cervical sprain to get better depends on the cause and extent of the injury. Most cervical sprains heal in 1 to 3 weeks. CAUSES  Severe cervical sprains may be caused by:   Contact sport injuries (such as from football, rugby, wrestling, hockey, auto racing, gymnastics, diving, martial arts, or boxing).   Motor vehicle collisions.   Whiplash injuries. This is an injury from a sudden forward and backward whipping movement of the head and neck.  Falls.  Mild cervical sprains may be caused by:   Being in an awkward position, such as while cradling a telephone between your ear and shoulder.   Sitting in a chair that does not offer proper support.   Working at a poorly Marketing executive station.   Looking up or down for long periods of time.  SYMPTOMS   Pain, soreness, stiffness, or a burning sensation in the front, back, or sides of the neck. This discomfort may develop immediately after the injury or slowly, 24 hours or more after the injury.   Pain or tenderness directly in the middle  of the back of the neck.   Shoulder or upper back pain.   Limited ability to move the neck.   Headache.   Dizziness.   Weakness, numbness, or tingling in the hands or arms.   Muscle spasms.   Difficulty swallowing or chewing.   Tenderness and swelling of the neck.  DIAGNOSIS  Most of the time your health care provider can diagnose a cervical sprain by taking your history and doing a physical exam. Your health care provider will ask about previous neck injuries and any known neck problems, such as arthritis in the neck. X-rays may be taken to find out if there are any other problems, such as with the bones of the neck. Other tests, such as a CT scan or MRI, may also be needed.  TREATMENT  Treatment depends on the severity of the cervical sprain. Mild sprains can be treated with rest, keeping the neck in place (immobilization), and pain medicines. Severe cervical sprains are immediately immobilized. Further treatment is done to help with pain, muscle spasms, and other symptoms and may include:  Medicines, such as pain relievers, numbing medicines, or muscle relaxants.   Physical therapy. This may involve stretching exercises, strengthening exercises, and posture training. Exercises and improved posture can help stabilize the neck, strengthen muscles, and help stop symptoms from returning.  HOME CARE INSTRUCTIONS   Put ice on the injured area.   Put ice in a plastic bag.   Place a towel between your skin and the bag.  Leave the ice on for 15-20 minutes, 3-4 times a day.   If your injury was severe, you may have been given a cervical collar to wear. A cervical collar is a two-piece collar designed to keep your neck from moving while it heals.  Do not remove the collar unless instructed by your health care provider.  If you have long hair, keep it outside of the collar.  Ask your health care provider before making any adjustments to your collar. Minor adjustments  may be required over time to improve comfort and reduce pressure on your chin or on the back of your head.  Ifyou are allowed to remove the collar for cleaning or bathing, follow your health care provider's instructions on how to do so safely.  Keep your collar clean by wiping it with mild soap and water and drying it completely. If the collar you have been given includes removable pads, remove them every 1-2 days and hand wash them with soap and water. Allow them to air dry. They should be completely dry before you wear them in the collar.  If you are allowed to remove the collar for cleaning and bathing, wash and dry the skin of your neck. Check your skin for irritation or sores. If you see any, tell your health care provider.  Do not drive while wearing the collar.   Only take over-the-counter or prescription medicines for pain, discomfort, or fever as directed by your health care provider.   Keep all follow-up appointments as directed by your health care provider.   Keep all physical therapy appointments as directed by your health care provider.   Make any needed adjustments to your workstation to promote good posture.   Avoid positions and activities that make your symptoms worse.   Warm up and stretch before being active to help prevent problems.  SEEK MEDICAL CARE IF:   Your pain is not controlled with medicine.   You are unable to decrease your pain medicine over time as planned.   Your activity level is not improving as expected.  SEEK IMMEDIATE MEDICAL CARE IF:   You develop any bleeding.  You develop stomach upset.  You have signs of an allergic reaction to your medicine.   Your symptoms get worse.   You develop new, unexplained symptoms.   You have numbness, tingling, weakness, or paralysis in any part of your body.  MAKE SURE YOU:   Understand these instructions.  Will watch your condition.  Will get help right away if you are not doing well  or get worse. Document Released: 09/23/2007 Document Revised: 12/01/2013 Document Reviewed: 06/03/2013 [facility] Patient Information 2015 [place], [place]. This information is not intended to replace advice given to you by your health care provider. Make sure you discuss any questions you have with your health care provider.    Emergency Department Resource Guide 1) Find a Doctor and Pay Out of Pocket Although you won't have to find out who is covered by your insurance plan, it is a good idea to ask around and get recommendations. You will then need to call the office and see if the doctor you have chosen will accept you as a new patient and what types of options they offer for patients who are self-pay. Some doctors offer discounts or will set up payment plans for their patients who do not have insurance, but you will need to ask so you aren't surprised when you get to your appointment.  2) Contact Your Local  Health Department Not all health departments have doctors that can see patients for sick visits, but many do, so it is worth a call to see if yours does. If you don't know where your local health department is, you can check in your phone book. The CDC also has a tool to help you locate your state's health department, and many state websites also have listings of all of their local health departments.  3) Find a Walk-in Clinic If your illness is not likely to be very severe or complicated, you may want to try a walk in clinic. These are popping up all over the country in pharmacies, drugstores, and shopping centers. They're usually staffed by nurse practitioners or physician assistants that have been trained to treat common illnesses and complaints. They're usually fairly quick and inexpensive. However, if you have serious medical issues or chronic medical problems, these are probably not your best option.  No Primary Care Doctor: - Call Health Connect at  [phone] - they can help you locate a  primary care doctor that  accepts your insurance, provides certain services, etc. - Physician Referral Service- [phone]  Chronic Pain Problems: Organization         Address  Phone   Notes  [facility]  [phone] Patients need to be referred by their primary care doctor.   Medication Assistance: Organization         Address  Phone   Notes  [facility] Medication [facility] [address] [place] [place], [place] [address] [phone] --Must be a resident of [facility] -- Must have NO insurance coverage whatsoever (no Medicaid/ Medicare, etc.) -- The pt. MUST have a primary care doctor that directs their care regularly and follows them in the community   [facility]ist  [phone]   [employer]  [phone]    Agencies that provide inexpensive medical care: Organization         Address  Phone   Notes  [facility]  [phone]   [facility]    [phone]   [facility] [address] [place], [place] [address] [phone]   [facility] [address] [place], [place] [phone]   Planned Parenthood    [phone]   [facility]    [phone]   Community Health and [facility]  [address], [place] Phone:  [phone], Fax:  [phone] Hours of Operation:  9 am - 6 pm, M-F.  Also accepts Medicaid/Medicare and self-pay.  [facility]  [address], [place] Phone: [phone], Fax: [phone]. Hours of Operation:  8:30 am - 5:30 pm, M-F.  Also accepts Medicaid and self-pay.  [facility] [place] [address], [place] Phone: [phone]   [facility] [address] Natasha Bence [place], [place] [phone], Ext. [phone] Mondays & Thursdays: 7-9 AM.  First 15 patients are seen on a first come, first serve basis.    Medicaid-accepting [facility]  Providers:  Organization         Address  Phone   Notes  [facility] [address], [place] [phone] Also accepts self-pay patients.  [facility] [address] Laurell Josephs [employer], [place]  [phone]   [facility] [address], [place] [phone]  [facility] [address] [place] [address], [place] [phone]) [phone]   Renaye RakersVeita Bland [address], [place]   [phone]) [phone] Only accepts [place] Access [place] patients after they have their name applied to their card.   Self-Pay (no insurance) in [facility]:  Organization         Address  Phone   Notes  Sickle Cell Patients, [facility] [address], [place] [phone]   [facility] [address], [place] [phone]   [facility] [place]  [address], [place] [phone]) [phone]   [facility]/Dr. Osei-Bonsu  [address] [place] [address], [place] or [phone], [place] [phone] Phone number for both [place] and [place] locations is the same.  [facility] [address], [place] [phone]   [facility] [address] [place] [address], [place] or [address] [phone]   [facility] [address], [place] [phone], phone; [phone], fax Sees patients 1st and 3rd Saturday of every month.  Must not qualify for public or private insurance (i.e. Medicaid, Medicare, [place] Health Choice, Veterans' Benefits)  Household income should be no more than 200% of the poverty level The clinic cannot treat you if you are pregnant or think you are pregnant  Sexually transmitted diseases are not treated at the clinic.    Dental Care: Organization         Address  Phone  Notes  [facility] [address] [place] [address], [place] [phone]) [phone] Accepts children up to age 26 who are enrolled in [place]icaid or [place] Health Choice; pregnant women with a Medicaid card; and children who have applied for Medicaid or [place] Health Choice, but were declined, whose parents can pay a reduced fee at time of service.  [facility] of [facility] [place]  [address], [place] [phone] Accepts children up to age 621 who are enrolled in [place]icaid or [place] Health Choice; pregnant women with a Medicaid card; and children who have applied for Medicaid or [place] Health Choice, but were declined, whose parents can pay a reduced fee at time of service.  [facility]  [address], [place] [phone] Patients are seen by appointment only. Walk-ins are not accepted. [facility] will see patients 26 years of age and older. Monday - Tuesday (8am-5pm) Most Wednesdays (8:30-5pm) $30 per visit, cash only  [facility]  [address], [facility] [phone] Patients are seen by appointment only. Walk-ins are not accepted. [facility] will see patients 26 years of age and older. One Wednesday Evening (Monthly: Volunteer Based).  $30 per visit, cash only  [employer] [facility]  [phone] for adults; Children under age 64, call Graduate Pediatric Dentistry at [phone]. Children aged 674-14, please call [phone] to request a pediatric application.  Dental services are provided in all areas of dental care including fillings, crowns and bridges, complete and partial dentures, implants, gum treatment, root canals, and extractions. Preventive care is also provided. Treatment is provided to both adults and children. Patients are selected via a lottery and there is often a waiting list.   [facility] [address], [place]  [phone]) [phone] [email or web address]  [facility]  [address], [place], [place] [phone], Ext. [phone] Second and Fourth Thursday of each month, opens at 6:30 AM; Clinic ends at 9 AM.  Patients are seen on a first-come first-served basis, and a limited number are seen during each clinic.   [facility]  [address], [place], [place] [phone]) [phone]   Eligibility Requirements You must have lived in [place], [place], or [place] counties for at least the last three months.   You cannot be eligible for state or federal sponsored National Cityhealthcare insurance, including CIGNAVeterans Administration, [place]icaid, or [employer] EntertainmentMedicare.   You generally cannot be eligible for healthcare insurance through your employer.    How to apply: Eligibility screenings are held every Tuesday and Wednesday afternoon from 1:00 pm until 4:00 pm. You do not need an appointment for the interview!  [facility] [address], [place], [place] [phone]   [facility]  [phone]   [facility]  [phone]   [facility]  [phone]    Behavioral Health Resources in the Community: Intensive Outpatient Programs Organization         Address  Phone  Notes  [facility] [address], [place], [place] [phone]   [facility] [address], [place], [place] [phone]   [facility] [address], [place], [place]  [phone]   [facility] [address],  [place], [place] [phone] or [phone]   Substance Abuse Resources Organization         Address  Phone  Notes  [facility]  [phone]   [facility]  [phone]   [facility]  [phone]   [facility]  [phone]   Residential & Outpatient Substance Abuse Program  [phone]   Psychological Services Organization         Address  Phone  Notes  [facility]  [phone]- [phone]    [facility]  [phone]- [phone]   [facility] [address], [place] [phone] or [phone]    Mobile Crisis Teams Organization         Address  Phone  Notes  [facility], Mobile Crisis Care Unit  [phone]   [facility]  [address] [place], [place] [phone]   Doristine LocksSharon DeEsch [address] [place] [phone]    Self-Help/Support Groups Organization         Address  Phone             Notes  [facility] [place] - variety of support groups  [phone] Call for more information  Narcotics Anonymous (NA), [facility] [address], [place]  2 meetings at this location   Statisticianesidential Treatment Programs Organization         Address  Phone  Notes  [facility] [address],    [place]  [phone]   [facility]  [address], [place] [phone], [place], [place] [phone]   [facility] [address], [place] [phone] Admissions: 8am-3pm M-F  [facility] [address]    [place], [place] [phone]   [facility] [address], [place], [place] [phone]   [facility] [address]  [place], [place] [phone]   [facility] - Intensive Outpatient [phone] Laurell Josephs [address], [place], [place] [phone]   [facility] ([facility] [address]  [place], [place] [phone] or [phone]   [facility] (RTS) [address] [place], [place] [phone] Accepts Medicaid  [facility] [place] [address]  [place] [phone] Substance Abuse/Addiction Treatment   [facility] Organization         Address  Phone  Notes  [facility]  [phone]   Angie Fava, PhD [address] Ervin Knack [place], [place]   [phone] or [phone]    [facility]   [address] [place], [place] [phone]   [facility] [address] [place], [place] [phone] Insurance/Medicaid/sponsorship through [facility] [address]                                    [place], [place] [phone] Therapy/tele-psych/case  [facility] [address], [place] [phone]    Dr. Lolly Mustache  [phone]   [facility]  [employer] [facility] Dept. 1) [address] [place] 2) [address] [place] 3)  [address], [place] [phone]  [phone]   [facility] Child Abuse Hotline [phone] or [phone] (After Hours)
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
## 2015-08-09 LAB — OB RESULTS CONSOLE RUBELLA ANTIBODY, IGM: Rubella: IMMUNE

## 2015-08-09 LAB — OB RESULTS CONSOLE ABO/RH: RH Type: NEGATIVE

## 2015-08-09 LAB — OB RESULTS CONSOLE RPR: RPR: NONREACTIVE

## 2015-08-09 LAB — OB RESULTS CONSOLE HEPATITIS B SURFACE ANTIGEN: Hepatitis B Surface Ag: NEGATIVE

## 2015-08-09 LAB — OB RESULTS CONSOLE ANTIBODY SCREEN: Antibody Screen: NEGATIVE

## 2015-08-09 LAB — OB RESULTS CONSOLE HIV ANTIBODY (ROUTINE TESTING): HIV: NONREACTIVE

## 2015-08-17 LAB — OB RESULTS CONSOLE GC/CHLAMYDIA
Chlamydia: NEGATIVE
GC PROBE AMP, GENITAL: NEGATIVE

## 2015-12-24 ENCOUNTER — Inpatient Hospital Stay (HOSPITAL_COMMUNITY)
Admission: AD | Admit: 2015-12-24 | Discharge: 2015-12-25 | Disposition: A | Discharge status: Home / Self Care | Payer: BLUE CROSS/BLUE SHIELD | Source: Ambulatory Visit | Attending: Obstetrics and Gynecology | Admitting: Obstetrics and Gynecology

## 2015-12-24 ENCOUNTER — Encounter (HOSPITAL_COMMUNITY): Payer: Self-pay | Admitting: *Deleted

## 2015-12-24 DIAGNOSIS — O99613 Diseases of the digestive system complicating pregnancy, third trimester: Secondary | ICD-10-CM | POA: Insufficient documentation

## 2015-12-24 DIAGNOSIS — E860 Dehydration: Secondary | ICD-10-CM | POA: Diagnosis not present

## 2015-12-24 DIAGNOSIS — K529 Noninfective gastroenteritis and colitis, unspecified: Secondary | ICD-10-CM | POA: Insufficient documentation

## 2015-12-24 DIAGNOSIS — Z3A29 29 weeks gestation of pregnancy: Secondary | ICD-10-CM | POA: Insufficient documentation

## 2015-12-24 DIAGNOSIS — O212 Late vomiting of pregnancy: Secondary | ICD-10-CM | POA: Diagnosis present

## 2015-12-24 DIAGNOSIS — Z3689 Encounter for other specified antenatal screening: Secondary | ICD-10-CM

## 2015-12-24 DIAGNOSIS — Z6791 Unspecified blood type, Rh negative: Secondary | ICD-10-CM | POA: Diagnosis not present

## 2015-12-24 DIAGNOSIS — O26893 Other specified pregnancy related conditions, third trimester: Secondary | ICD-10-CM | POA: Insufficient documentation

## 2015-12-24 DIAGNOSIS — O99213 Obesity complicating pregnancy, third trimester: Secondary | ICD-10-CM | POA: Diagnosis not present

## 2015-12-24 DIAGNOSIS — Z3493 Encounter for supervision of normal pregnancy, unspecified, third trimester: Secondary | ICD-10-CM

## 2015-12-24 LAB — URINALYSIS, ROUTINE W REFLEX MICROSCOPIC
Glucose, UA: NEGATIVE mg/dL
Hgb urine dipstick: NEGATIVE
Ketones, ur: >80 mg/dL — AB
Leukocytes, UA: NEGATIVE
NITRITE: NEGATIVE
PH: 6.5 (ref 5.0–8.0)
Protein, ur: 30 mg/dL — AB
Specific Gravity, Urine: 1.025 (ref 1.005–1.030)

## 2015-12-24 LAB — URINE MICROSCOPIC-ADD ON

## 2015-12-24 NOTE — MAU Note (Signed)
Have had n/v entire pregnancy. Vomiting since this am. Zofran called in at 1500 but not helping. From 1400 until now has noticed decreased FM. Denies LOF or bleeding. Diarrhea 0900 x 1 but none since

## 2015-12-25 MED ORDER — LACTATED RINGERS IV BOLUS (SEPSIS)
1000.0000 mL | Freq: Once | INTRAVENOUS | Status: AC
Start: 2015-12-25 — End: 2015-12-25
  Administered 2015-12-25: 1000 mL via INTRAVENOUS

## 2015-12-25 MED ORDER — PROMETHAZINE HCL 25 MG/ML IJ SOLN
25.0000 mg | Freq: Four times a day (QID) | INTRAMUSCULAR | Status: DC | PRN
  Administered 2015-12-25: 25 mg via INTRAVENOUS
  Filled 2015-12-25 (×2): qty 1

## 2015-12-25 MED ORDER — M.V.I. ADULT IV INJ
Freq: Once | INTRAVENOUS | Status: AC
  Administered 2015-12-25: 02:00:00 via INTRAVENOUS
  Filled 2015-12-25: qty 10

## 2015-12-25 NOTE — MAU Provider Note (Signed)
History     CSN: [id number]  Arrival date and time: 12/24/15 2300    No chief complaint on file.  HPI Comments: G2P1011 @29 .[redacted] weeks gestation c/o N/V since 0800. Took Zofran po earlier w/o relief. Unable to tolerate po. Diarrhea x1 episode this am. No fever. No resp sx. No one in household sick. Decreased FM this pm. Irregular BH ctx. No VB or LOF. Pregnancy complicated by morbid obesity and Rh negative.   OB History    Gravida Para Term Preterm AB TAB SAB Ectopic Multiple Living   3 1 1  1  1   1       Past Medical History  Diagnosis Date  . Medical history non-contributory     Past Surgical History  Procedure Laterality Date  . Tonsillectomy    . Dilation and curettage of uterus      Family History  Problem Relation Age of Onset  . Diabetes Maternal Grandfather   . Heart disease Maternal Grandfather   . Diabetes Paternal Grandfather   . Heart disease Paternal Grandfather     Social History  Substance Use Topics  . Smoking status: Never Smoker   . Smokeless tobacco: None  . Alcohol Use: No    Allergies: No Known Allergies  Prescriptions prior to admission  Medication Sig Dispense Refill Last Dose  . acetaminophen (TYLENOL) 325 MG tablet Take 650 mg by mouth every 6 (six) hours as needed for moderate pain.   PRN  . methocarbamol (ROBAXIN) 500 MG tablet Take 1 tablet (500 mg total) by mouth 2 (two) times daily. 20 tablet 0   . naproxen (NAPROSYN) 500 MG tablet Take 1 tablet (500 mg total) by mouth 2 (two) times daily with a meal. 30 tablet 0     Review of Systems  Constitutional: Negative.   HENT: Negative.   Eyes: Negative.   Respiratory: Negative.   Cardiovascular: Negative.   Gastrointestinal: Positive for nausea, vomiting and diarrhea.  Genitourinary: Negative.   Musculoskeletal: Negative.   Skin: Negative.   Neurological: Negative.   Endo/Heme/Allergies: Negative.   Psychiatric/Behavioral: Negative.    Physical Exam   Temperature 98.5 F (36.9  C), resp. rate 18, height 5' 3.5" (1.613 m), weight 142.611 kg (314 lb 6.4 oz), last menstrual period 05/30/2015, unknown if currently breastfeeding.  VS: 136/74, 114, 18, 98.5  Physical Exam  Constitutional: She is oriented to person, place, and time. She appears well-developed and well-nourished.  HENT:  Head: Normocephalic and atraumatic.  Neck: Normal range of motion. Neck supple.  Cardiovascular: Normal rate.   Respiratory: Effort normal.  GI: Soft.  gravid  Genitourinary:  Deferred   Musculoskeletal: Normal range of motion.  Neurological: She is alert and oriented to person, place, and time.  Skin: Skin is warm and dry.  Psychiatric: She has a normal mood and affect.  EFM: 140 bpm, mod variability, +accels, no decels Toco: UI  Results for CHANY, WOOLWORTH (MRN [id number]) as of 12/25/2015 11:26  Ref. Range 12/24/2015 23:20  Appearance Latest Ref Range: CLEAR  CLEAR  Bacteria, UA Latest Ref Range: NONE SEEN  RARE (A)  Bilirubin Urine Latest Ref Range: NEGATIVE  SMALL (A)  Color, Urine Latest Ref Range: YELLOW  YELLOW  Glucose Latest Ref Range: NEGATIVE mg/dL NEGATIVE  Hgb urine dipstick Latest Ref Range: NEGATIVE  NEGATIVE  Ketones, ur Latest Ref Range: NEGATIVE mg/dL >62 (A)  Leukocytes, UA Latest Ref Range: NEGATIVE  NEGATIVE  Nitrite Latest Ref Range: NEGATIVE  NEGATIVE  pH Latest  Ref Range: 5.0-8.0  6.5  Protein Latest Ref Range: NEGATIVE mg/dL 30 (A)  RBC / HPF Latest Ref Range: 0-5 RBC/hpf 0-5  Specific Gravity, Urine Latest Ref Range: 1.005-1.030  1.025  Squamous Epithelial / LPF Latest Ref Range: NONE SEEN  0-5 (A)  Urine-Other Unknown MUCOUS PRESENT  WBC, UA Latest Ref Range: 0-5 WBC/hpf 0-5   MAU Course  Procedures IVF bolus Phenergan IV IV MTV Po challenge-tolerated cup of juice, no further episodes or N/V  Assessment and Plan  29.[redacted] weeks gestation Gastroenteritis Dehydration Reactive NST  Discharge home Maintain hydration BRAT diet Rest Zofran  prn (has Rx) Call for worsening sx Keep f/u as scheduled next week at [facility]  Kitrina Maurin, N 12/25/2015, 12:30 AM

## 2016-02-10 LAB — OB RESULTS CONSOLE GBS: STREP GROUP B AG: NEGATIVE

## 2016-03-02 ENCOUNTER — Inpatient Hospital Stay (HOSPITAL_COMMUNITY)
Admission: AD | Admit: 2016-03-02 | Discharge: 2016-03-02 | Disposition: A | Discharge status: Home / Self Care | Payer: BLUE CROSS/BLUE SHIELD | Source: Ambulatory Visit | Attending: Obstetrics | Admitting: Obstetrics

## 2016-03-02 ENCOUNTER — Encounter (HOSPITAL_COMMUNITY): Payer: Self-pay

## 2016-03-02 LAB — URINALYSIS, ROUTINE W REFLEX MICROSCOPIC
Bilirubin Urine: NEGATIVE
Glucose, UA: NEGATIVE mg/dL
Hgb urine dipstick: NEGATIVE
Ketones, ur: NEGATIVE mg/dL
Leukocytes, UA: NEGATIVE
Nitrite: NEGATIVE
Protein, ur: NEGATIVE mg/dL
Specific Gravity, Urine: 1.015 (ref 1.005–1.030)
pH: 6 (ref 5.0–8.0)

## 2016-03-02 LAB — AMNISURE RUPTURE OF MEMBRANE (ROM) NOT AT ARMC: Amnisure ROM: NEGATIVE

## 2016-03-02 NOTE — MAU Note (Addendum)
Since Thurs morning I have been leaking. Unsure if is water broken or extra d/c. N/V since Weds. Aot of pelvic pain constantly but worse when I'm walking or standing. Denies bleeding. 2.5cm last svd. Gest diab diet controlled and poly. No diarrhea

## 2016-03-03 ENCOUNTER — Inpatient Hospital Stay (HOSPITAL_COMMUNITY)
Admission: RE | Admit: 2016-03-03 | Discharge: 2016-03-05 | DRG: 774 | Disposition: A | Discharge status: Home / Self Care | Payer: BLUE CROSS/BLUE SHIELD | Source: Ambulatory Visit | Attending: Obstetrics | Admitting: Obstetrics

## 2016-03-03 ENCOUNTER — Encounter (HOSPITAL_COMMUNITY): Payer: Self-pay

## 2016-03-03 DIAGNOSIS — O872 Hemorrhoids in the puerperium: Secondary | ICD-10-CM | POA: Diagnosis present

## 2016-03-03 DIAGNOSIS — Z6841 Body Mass Index (BMI) 40.0 and over, adult: Secondary | ICD-10-CM | POA: Diagnosis not present

## 2016-03-03 DIAGNOSIS — O320XX0 Maternal care for unstable lie, not applicable or unspecified: Secondary | ICD-10-CM | POA: Diagnosis present

## 2016-03-03 DIAGNOSIS — J45909 Unspecified asthma, uncomplicated: Secondary | ICD-10-CM | POA: Diagnosis present

## 2016-03-03 DIAGNOSIS — O3663X0 Maternal care for excessive fetal growth, third trimester, not applicable or unspecified: Secondary | ICD-10-CM | POA: Diagnosis present

## 2016-03-03 DIAGNOSIS — O99214 Obesity complicating childbirth: Secondary | ICD-10-CM | POA: Diagnosis present

## 2016-03-03 DIAGNOSIS — O24419 Gestational diabetes mellitus in pregnancy, unspecified control: Secondary | ICD-10-CM | POA: Diagnosis present

## 2016-03-03 DIAGNOSIS — O1205 Gestational edema, complicating the puerperium: Secondary | ICD-10-CM | POA: Diagnosis present

## 2016-03-03 DIAGNOSIS — Z3A39 39 weeks gestation of pregnancy: Secondary | ICD-10-CM

## 2016-03-03 DIAGNOSIS — O403XX0 Polyhydramnios, third trimester, not applicable or unspecified: Secondary | ICD-10-CM | POA: Diagnosis present

## 2016-03-03 DIAGNOSIS — O24420 Gestational diabetes mellitus in childbirth, diet controlled: Principal | ICD-10-CM | POA: Diagnosis present

## 2016-03-03 DIAGNOSIS — R609 Edema, unspecified: Secondary | ICD-10-CM | POA: Diagnosis present

## 2016-03-03 DIAGNOSIS — O9952 Diseases of the respiratory system complicating childbirth: Secondary | ICD-10-CM | POA: Diagnosis present

## 2016-03-03 LAB — CBC
HCT: 34.5 % — ABNORMAL LOW (ref 36.0–46.0)
Hemoglobin: 11.5 g/dL — ABNORMAL LOW (ref 12.0–15.0)
MCH: 28.5 pg (ref 26.0–34.0)
MCHC: 33.3 g/dL (ref 30.0–36.0)
MCV: 85.4 fL (ref 78.0–100.0)
Platelets: 262 10*3/uL (ref 150–400)
RBC: 4.04 MIL/uL (ref 3.87–5.11)
RDW: 15.6 % — ABNORMAL HIGH (ref 11.5–15.5)
WBC: 10.1 10*3/uL (ref 4.0–10.5)

## 2016-03-03 LAB — TYPE AND SCREEN
ABO/RH(D): O NEG
Antibody Screen: NEGATIVE

## 2016-03-03 MED ORDER — CITRIC ACID-SODIUM CITRATE 334-500 MG/5ML PO SOLN: 30.0000 mL | ORAL | Status: DC | PRN

## 2016-03-03 MED ORDER — LACTATED RINGERS IV SOLN: 500.0000 mL | INTRAVENOUS | Status: DC | PRN

## 2016-03-03 MED ORDER — OXYCODONE-ACETAMINOPHEN 5-325 MG PO TABS: 1.0000 | ORAL_TABLET | ORAL | Status: DC | PRN

## 2016-03-03 MED ORDER — LACTATED RINGERS IV SOLN
INTRAVENOUS | Status: DC
  Administered 2016-03-03: via INTRAVENOUS
  Administered 2016-03-03 (×2): 125 mL/h via INTRAVENOUS

## 2016-03-03 MED ORDER — LIDOCAINE HCL (PF) 1 % IJ SOLN
30.0000 mL | INTRAMUSCULAR | Status: DC | PRN
  Filled 2016-03-03: qty 30

## 2016-03-03 MED ORDER — OXYTOCIN BOLUS FROM INFUSION: 500.0000 mL | INTRAVENOUS | Status: DC

## 2016-03-03 MED ORDER — ACETAMINOPHEN 325 MG PO TABS: 650.0000 mg | ORAL_TABLET | ORAL | Status: DC | PRN

## 2016-03-03 MED ORDER — OXYTOCIN 10 UNIT/ML IJ SOLN
1.0000 m[IU]/min | INTRAVENOUS | Status: DC
  Administered 2016-03-03: 2 m[IU]/min via INTRAVENOUS
  Filled 2016-03-03: qty 10

## 2016-03-03 MED ORDER — OXYTOCIN 10 UNIT/ML IJ SOLN: 2.5000 [IU]/h | INTRAVENOUS | Status: DC

## 2016-03-03 MED ORDER — OXYCODONE-ACETAMINOPHEN 5-325 MG PO TABS: 2.0000 | ORAL_TABLET | ORAL | Status: DC | PRN

## 2016-03-03 NOTE — Progress Notes (Signed)
S:  Feeling ctx  - not painful but getting uncomfortable  O:  VS: Blood pressure 127/68, pulse 93, temperature 98.9 F (37.2 C), temperature source Oral, resp. rate 16, height 5\' 4"  (1.626 m), weight 142.429 kg (314 lb), last menstrual period 05/30/2015, unknown if currently breastfeeding.        FHR : baseline 145 / variability moderate / accelerations + / no decelerations        Toco: contractions every 2-3 minutes / pitocin 8 mu/min         Cervix : 2cm mid position now / soft / 80% / vtx -3         Membranes: intact / sweeped membranes        Cervical balloon placed - 60 ml inflated / traction and secured to leg  A: latent labor     FHR category 1  P: labor induction       Desires un-medicated labor if possible     Cervical balloon with AROM after 4-5 cm for labor tolerance     Recheck 1-3 hours    Marlinda MikeBAILEY, TANYA CNM, MSN, [facility]ACNM 03/03/2016, 1:57 PM

## 2016-03-03 NOTE — Progress Notes (Signed)
S:  Painful ctx / bloody show  O:  VS: Blood pressure 124/63, pulse 105, temperature 98.9 F (37.2 C), temperature source Oral, resp. rate 16, height 5\' 4"  (1.626 m), weight 142.429 kg (314 lb), last menstrual period 05/30/2015, unknown if currently breastfeeding.        FHR : baseline 145 / variability moderate / accelerations + / no decelerations        Toco: contractions every 2-3 minutes / pitocin 12 mu/min        Cervix : cervical balloon in vaginal - removed /6cm / 90% / vtx -1 / BBOW          Membranes AROM with slow leak over 5 minutes  A: active labor     FHR category 1  P: expectant management      Anticipate delivery in next 2-4 hours     Marlinda MikeBAILEY, Rhyse Skowron CNM, MSN, [facility]ACNM 03/03/2016, 4:34 PM

## 2016-03-03 NOTE — Progress Notes (Signed)
S:  painful ctx with urge to push with some ctx       repositioned lateral - hands to knees - up to BRP ad lib  O:  VS: Blood pressure 124/69, pulse 113, temperature 97.7 F (36.5 C), temperature source Oral, resp. rate 18, height 5\' 4"  (1.626 m), weight 142.429 kg (314 lb), last menstrual period 05/30/2015, unknown if currently breastfeeding.        FHR : baseline 120 / variability moderate / accelerations + / mild variable decelerations        Toco: contractions every 2-4 minutes / moderate to strong         Cervix : complete vtx 0 station asynclitic        Membranes: clear fluid with show  A: active labor     FHR category 2  P: trial of pushing without progress  - repositioned lateral with peanut       active second stage with strong urge to push      increase pitocin in next hour if no urge to push    Marlinda MikeBAILEY, Murray Guzzetta CNM, MSN, [facility]ACNM 03/03/2016, 10:57 PM

## 2016-03-03 NOTE — H&P (Signed)
OB ADMISSION/ HISTORY & PHYSICAL:  Admission Date: 03/03/2016 10:02 AM  Admit Diagnosis: 39.3 weeks / GDMa1 / polyhydramnios / unstable lie / maternal obesity  Deanna Avila is a 25 y.o. female presenting for labor induction.  Prenatal History: G3P1011   EDC : 03/08/2016, Date entered prior to episode creation  Prenatal care at [facility]  Primary Ob Provider: Donette Larry CNM Prenatal course complicated by maternal obesity / GMDa1 / polyhydramnios / LGA  Prenatal Labs: ABO, Rh: O/Negative/-- (08/30 0000) Antibody: Negative (08/30 0000) Rubella: Immune (08/30 0000)  RPR: Nonreactive (08/30 0000)  HBsAg: Negative (08/30 0000)  HIV: Non-reactive (08/30 0000)  GTT: ABNORMAL GBS: Negative (03/03 0000)   SONO: EFW 8 pounds / last growth ~36 weeks with AGA at 81% symmetric (wt estimated then 6-3)              AFI 20-28 in past month  Medical / Surgical History : Asthma - stable (prn inhaler use) Morbid obesity                                                 Past medical history:     Past surgical history:  Past Surgical History  Procedure Laterality Date  . Tonsillectomy    . Dilation and curettage of uterus      Family History:  Family History  Problem Relation Age of Onset  . Diabetes Maternal Grandfather   . Heart disease Maternal Grandfather   . Diabetes Paternal Grandfather   . Heart disease Paternal Grandfather      Social History:  reports that she has never smoked. She has never used smokeless tobacco. She reports that she does not drink alcohol or use illicit drugs.   Allergies: Review of patient's allergies indicates no known allergies.    Current Medications at time of admission:  Prior to Admission medications   Medication Sig Start Date End Date Taking? Authorizing Provider  acetaminophen (TYLENOL) 500 MG tablet Take 1,000 mg by mouth every 6 (six) hours as needed for mild pain or headache.    Historical Provider, MD   albuterol (PROVENTIL HFA;VENTOLIN HFA) 108 (90 Base) MCG/ACT inhaler Inhale 2 puffs into the lungs every 6 (six) hours as needed for wheezing or shortness of breath.    Historical Provider, MD  Evening Primrose Oil 1000 MG CAPS Take 1 capsule by mouth 2 (two) times daily.    Historical Provider, MD  Evening Primrose Oil 1000 MG CAPS Place 2 capsules vaginally at bedtime.    Historical Provider, MD  Prenatal Vit-Fe Fumarate-FA (PRENATAL MULTIVITAMIN) TABS tablet Take 1 tablet by mouth at bedtime.    Historical Provider, MD  ranitidine (ZANTAC) 150 MG tablet Take 150 mg by mouth 2 (two) times daily.    Historical Provider, MD   Review of Systems: Active FM Rare ctx No LOF bloody show absent  Physical Exam:  VS: Temperature 98 F (36.7 C), temperature source Oral, resp. rate 18, height  (1.626 m), weight 143.609 kg (316 lb 9.6 oz), last menstrual period 05/30/2015, unknown if currently breastfeeding.  General: alert and oriented, appears calm and comfortable Heart: RRR Lungs: Clear lung fields Abdomen: Gravid, soft and non-tender, non-distended with large panus / uterus: gravid Extremities: 1+ pedal and ankle edema  Genitalia / VE:  posterior / 2cm / 60% / vtx -4 ballotable  Bedside SONO: vtx presentation / LOT / subjective normal AFI level / + cardiac activity / + breathing motions visible  FHR: baseline rate 145 / variability moderate / accelerations + / no decelerations TOCO: rare ctx  Assessment: 39.[redacted] weeks gestation GDMa1 Polyhydramnios with unstable lie LGA ~ 8 pounds Induction of labor - direct admit as no induction time available x 9 days  FHR category 1  Plan:  Admit Pitocin induction  Dr Ernestina PennaFogleman notified of admission   Marlinda MikeBAILEY, Fredi Hurtado CNM, MSN, [facility]NM 03/03/2016, 10:39 AM

## 2016-03-04 ENCOUNTER — Encounter (HOSPITAL_COMMUNITY): Payer: Self-pay | Admitting: Certified Nurse Midwife

## 2016-03-04 DIAGNOSIS — R609 Edema, unspecified: Secondary | ICD-10-CM | POA: Diagnosis present

## 2016-03-04 DIAGNOSIS — O872 Hemorrhoids in the puerperium: Secondary | ICD-10-CM | POA: Diagnosis present

## 2016-03-04 LAB — RPR: RPR Ser Ql: NONREACTIVE

## 2016-03-04 MED ORDER — LANOLIN HYDROUS EX OINT: TOPICAL_OINTMENT | CUTANEOUS | Status: DC | PRN

## 2016-03-04 MED ORDER — IBUPROFEN 600 MG PO TABS
600.0000 mg | ORAL_TABLET | Freq: Four times a day (QID) | ORAL | Status: DC
  Administered 2016-03-04 – 2016-03-05 (×6): 600 mg via ORAL
  Filled 2016-03-04 (×6): qty 1

## 2016-03-04 MED ORDER — SIMETHICONE 80 MG PO CHEW: 80.0000 mg | CHEWABLE_TABLET | ORAL | Status: DC | PRN

## 2016-03-04 MED ORDER — DIBUCAINE 1 % RE OINT: 1.0000 "application " | TOPICAL_OINTMENT | RECTAL | Status: DC | PRN

## 2016-03-04 MED ORDER — HYDROCORTISONE ACETATE 25 MG RE SUPP
25.0000 mg | Freq: Two times a day (BID) | RECTAL | Status: DC
  Administered 2016-03-04: 25 mg via RECTAL
  Filled 2016-03-04 (×5): qty 1

## 2016-03-04 MED ORDER — HYDROCHLOROTHIAZIDE 12.5 MG PO CAPS
12.5000 mg | ORAL_CAPSULE | Freq: Every day | ORAL | Status: DC
  Administered 2016-03-04 – 2016-03-05 (×2): 12.5 mg via ORAL
  Filled 2016-03-04 (×3): qty 1

## 2016-03-04 MED ORDER — ACETAMINOPHEN 325 MG PO TABS: 650.0000 mg | ORAL_TABLET | ORAL | Status: DC | PRN

## 2016-03-04 MED ORDER — HYDROCORTISONE ACE-PRAMOXINE 1-1 % RE FOAM
1.0000 | Freq: Two times a day (BID) | RECTAL | Status: DC
  Administered 2016-03-04 – 2016-03-05 (×2): 1 via RECTAL
  Filled 2016-03-04 (×2): qty 10

## 2016-03-04 MED ORDER — HYDROCORTISONE ACE-PRAMOXINE 1-1 % RE CREA
TOPICAL_CREAM | Freq: Four times a day (QID) | RECTAL | Status: DC
  Filled 2016-03-04: qty 30

## 2016-03-04 MED ORDER — WITCH HAZEL-GLYCERIN EX PADS: 1.0000 "application " | MEDICATED_PAD | CUTANEOUS | Status: DC | PRN

## 2016-03-04 MED ORDER — BENZOCAINE-MENTHOL 20-0.5 % EX AERO
1.0000 "application " | INHALATION_SPRAY | CUTANEOUS | Status: DC | PRN
  Administered 2016-03-04: 1 via TOPICAL
  Filled 2016-03-04: qty 56

## 2016-03-04 NOTE — Progress Notes (Signed)
S:  Urge to push - more pressure  O:  VS: Blood pressure 140/82, pulse 112, temperature 98.2 F (36.8 C), temperature source Oral, resp. rate 20, height 5\' 4"  (1.626 m), weight 142.429 kg (314 lb), last menstrual period 05/30/2015, SpO2 98 %, unknown if currently breastfeeding.        FHR : baseline 125 / variability moderate / accelerations + / mild variable decelerations        Toco: contractions every 2-4 minutes / pitocin 18 mu/min         Cervix : complete / vtx + 2        Membranes: clear with show  A: active labor with slow descent     FHR category 2  P: start pushing - consult with no descent in next 30-45 miunutes    Marlinda MikeBAILEY, Odes Lolli CNM, MSN, FACNM 03/04/2016, 1:21 AM

## 2016-03-04 NOTE — Lactation Note (Signed)
This note was copied from a baby's chart. Lactation Consultation Note  Initial visit made.  Breastfeeding consultation services and support information left with patient.  Baby is 12 hours old and has latched on but sleepy the past 4 hours and won't wake for feeding.  Reassured mom and discussed first 24 hour feeding behavior.  Cluster feeding on day 2-3 also discussed.  Mom breastfed her first baby for 16 months.  She used a nipple shield for 9 weeks and then baby weaned from it.  Mom states left nipple is flat so she is using a hand pump prior to latch.  Encouraged to call for assist prn.  Patient Name: Deanna Avila YNWGN'FToday's Date: 03/04/2016 Reason for consult: Initial assessment   Maternal Data Does the patient have breastfeeding experience prior to this delivery?: Yes  Feeding    LATCH Score/Interventions                      Lactation Tools Discussed/Used     Consult Status Consult Status: Follow-up Date: 03/05/16 Follow-up type: In-patient    Huston FoleyMOULDEN, Mick Tanguma S 03/04/2016, 2:46 PM

## 2016-03-05 LAB — CBC
HCT: 31.6 % — ABNORMAL LOW (ref 36.0–46.0)
HEMOGLOBIN: 10.2 g/dL — AB (ref 12.0–15.0)
MCH: 27.6 pg (ref 26.0–34.0)
MCHC: 32.3 g/dL (ref 30.0–36.0)
MCV: 85.6 fL (ref 78.0–100.0)
Platelets: 291 10*3/uL (ref 150–400)
RBC: 3.69 MIL/uL — AB (ref 3.87–5.11)
RDW: 15.9 % — ABNORMAL HIGH (ref 11.5–15.5)
WBC: 15.2 10*3/uL — AB (ref 4.0–10.5)

## 2016-03-05 LAB — GLUCOSE, CAPILLARY: GLUCOSE-CAPILLARY: 94 mg/dL (ref 65–99)

## 2016-03-05 MED ORDER — HYDROCORTISONE ACETATE 25 MG RE SUPP: 25.0000 mg | Freq: Two times a day (BID) | RECTAL | Status: AC

## 2016-03-05 MED ORDER — IBUPROFEN 600 MG PO TABS: 600.0000 mg | ORAL_TABLET | Freq: Four times a day (QID) | ORAL | Status: AC

## 2016-03-05 MED ORDER — HYDROCORTISONE ACE-PRAMOXINE 1-1 % RE FOAM: 1.0000 | Freq: Two times a day (BID) | RECTAL | Status: AC

## 2016-03-05 MED ORDER — HYDROCHLOROTHIAZIDE 12.5 MG PO CAPS: 12.5000 mg | ORAL_CAPSULE | Freq: Every day | ORAL | Status: AC

## 2016-03-05 MED ORDER — RHO D IMMUNE GLOBULIN 1500 UNIT/2ML IJ SOSY
300.0000 ug | PREFILLED_SYRINGE | Freq: Once | INTRAMUSCULAR | Status: AC
  Administered 2016-03-05: 300 ug via INTRAMUSCULAR
  Filled 2016-03-05: qty 2

## 2016-03-05 NOTE — Progress Notes (Signed)
Patient ID: Deanna Avila, female   DOB: 11/12/1991, 25 y.o.   MRN: [id number] PPD # 1 SVD  S:  Reports feeling well. Desires Early discharge.             Tolerating po/ No nausea or vomiting             Bleeding is light             Pain controlled with ibuprofen (OTC)             Up ad lib / ambulatory / voiding without difficulties    Newborn  Information for the patient's newborn:  [name], [name] [[id number]][[id number]]  female  breast feeding     O:  A & O x 3, in no apparent distress              VS:  Filed Vitals:   03/04/16 0524 03/04/16 0929 03/04/16 1820 03/05/16 0546  BP: 123/69 136/77 121/73 111/67  Pulse: 103 106 105 88  Temp: 99.3 F (37.4 C) 99.7 F (37.6 C) 98.5 F (36.9 C) 97.8 F (36.6 C)  TempSrc: Oral Oral Oral Oral  Resp: 18 20 18 18   Height:      Weight:      SpO2: 100% 99%  98%    LABS:  Recent Labs  03/03/16 1119 03/05/16 0527  WBC 10.1 15.2*  HGB 11.5* 10.2*  HCT 34.5* 31.6*  PLT 262 291    Blood type: --/--/O NEG (03/25 1119) / Infant Rh POS - Rhophylac indicated  Rubella: Immune (08/30 0000)   I&O: I/O last 3 completed shifts: In: -  Out: 559 [Urine:300; Blood:259]             Lungs: Clear and unlabored  Heart: regular rate and rhythm / no murmurs  Abdomen: soft, non-tender, non-distended             Fundus: firm, non-tender, U-even  Perineum: intact, no edema - ice pack in place for comfort  Lochia: minimal  Extremities: 1+ edema, no calf pain or tenderness, No Homans    A/P: PPD # 1  25 y.o., [id number]   Principal Problem:   Postpartum care following vaginal delivery (3/26) Active Problems:   Gestational diabetes mellitus in third trimester   Hemorrhoids in puerperium   Dependent edema   Doing well - stable status  Routine post partum orders  HCTZ 12.5 mg x 5 days  D/C home today  Schedule 6 wks PP visit and 2 hr fasting GTT in 6 wks    Raelyn MoraAWSON, Wayden Schwertner, M, MSN, CNM 03/05/2016, 8:31 AM

## 2016-03-05 NOTE — Discharge Summary (Signed)
OB Discharge Summary     Patient Name: Deanna Avila DOB: 12/18/1990 MRN: [id number]  Date of admission: 03/03/2016 Delivering MD: Marlinda MikeBAILEY, TANYA   Date of discharge: 03/05/2016  Admitting diagnosis: [redacted]WKS  GESTATIONAL DIABETIC  Intrauterine pregnancy: 3883w3d     Secondary diagnosis:  Principal Problem:   Postpartum care following vaginal delivery (3/26) Active Problems:   Gestational diabetes mellitus in third trimester   Hemorrhoids in puerperium   Dependent edema  Additional problems: none     Discharge diagnosis: Term Pregnancy Delivered                                                                                                Post partum procedures:rhogam  Augmentation: AROM, Pitocin and Foley Balloon  Complications: None  Hospital course:  Induction of Labor With Vaginal Delivery   25 y.o. yo [id number] at 7983w3d was admitted to the hospital 03/03/2016 for induction of labor.  Indication for induction: Favorable cervix at term and A1 DM.  Patient had an uncomplicated labor course as follows: Membrane Rupture Time/Date: 4:31 PM ,03/03/2016   Intrapartum Procedures: Episiotomy: None [1]                                         Lacerations:  None [1]  Patient had delivery of a Viable infant.  Information for the patient's newborn:  [name], Girl Elynor [[id number]][[id number]]  Delivery Method: Vaginal, Spontaneous Delivery (Filed from Delivery Summary)   03/04/2016  Details of delivery can be found in separate delivery note.  Patient had a routine postpartum course. Patient is discharged home 03/05/2016.   Physical exam  Filed Vitals:   03/04/16 0524 03/04/16 0929 03/04/16 1820 03/05/16 0546  BP: 123/69 136/77 121/73 111/67  Pulse: 103 106 105 88  Temp: 99.3 F (37.4 C) 99.7 F (37.6 C) 98.5 F (36.9 C) 97.8 F (36.6 C)  TempSrc: Oral Oral Oral Oral  Resp: 18 20 18 18   Height:      Weight:      SpO2: 100% 99%  98%   General: alert, cooperative and no distress Lochia:  appropriate Uterine Fundus: firm DVT Evaluation: No evidence of DVT seen on physical exam. Negative Homan's sign. No cords or calf tenderness. Calf/Ankle edema is present Labs: Lab Results  Component Value Date   WBC 15.2* 03/05/2016   HGB 10.2* 03/05/2016   HCT 31.6* 03/05/2016   MCV 85.6 03/05/2016   PLT 291 03/05/2016   No flowsheet data found.  Discharge instruction: per After Visit Summary and "Baby and Me Booklet".  After visit meds:    Medication List    TAKE these medications        acetaminophen 500 MG tablet  Commonly known as:  TYLENOL  Take 1,000 mg by mouth every 6 (six) hours as needed for mild pain or headache.     albuterol 108 (90 Base) MCG/ACT inhaler  Commonly known as:  PROVENTIL HFA;VENTOLIN HFA  Inhale 2 puffs into the lungs every 6 (six)  hours as needed for wheezing or shortness of breath.     hydrochlorothiazide 12.5 MG capsule  Commonly known as:  MICROZIDE  Take 1 capsule (12.5 mg total) by mouth daily.     hydrocortisone 25 MG suppository  Commonly known as:  ANUSOL-HC  Place 1 suppository (25 mg total) rectally 2 (two) times daily.     hydrocortisone-pramoxine rectal foam  Commonly known as:  PROCTOFOAM-HC  Place 1 applicator rectally 2 (two) times daily.     ibuprofen 600 MG tablet  Commonly known as:  ADVIL,MOTRIN  Take 1 tablet (600 mg total) by mouth every 6 (six) hours.     prenatal multivitamin Tabs tablet  Take 1 tablet by mouth at bedtime.     ranitidine 150 MG tablet  Commonly known as:  ZANTAC  Take 150 mg by mouth 2 (two) times daily.        Diet: routine diet  Activity: Advance as tolerated. Pelvic rest for 6 weeks.   Outpatient follow up:6 weeks Follow up Appt:No future appointments. Follow up Visit:No Follow-up on file.  Postpartum contraception: Undecided  Newborn Data: Live born female on [birth year]/36/2019 Birth Weight: 8 lb 3.6 oz (3730 g) APGAR: 7, 9  Baby Feeding: Breast Disposition:home with  mother   03/05/2016 Raelyn Mora, Judie Petit, CNM

## 2016-03-05 NOTE — Lactation Note (Signed)
This note was copied from a baby's chart. Lactation Consultation Note  Patient Name: Girl Runell GessKimberly Brosnahan [id number]'s Date: 03/05/2016 Reason for consult: Follow-up assessment;Other (Comment) (early D/C )  Baby is 6033 hours old, [facility].  Baby has been consistent at the breast and moms milk is in   Maternal Data Has patient been taught Hand Expression?: Yes  Feeding Feeding Type: Breast Fed Length of feed: 15 min  LATCH Score/Interventions Latch: Grasps breast easily, tongue down, lips flanged, rhythmical sucking.  Audible Swallowing: Spontaneous and intermittent  Type of Nipple: Everted at rest and after stimulation  Comfort (Breast/Nipple): Soft / non-tender     Hold (Positioning): No assistance needed to correctly position infant at breast.  LATCH Score: 10  Lactation Tools Discussed/Used Tools: Pump Breast pump type: Manual WIC Program: No Pump Review: Milk Storage;Setup, frequency, and cleaning Initiated by:: Reveiwed / MAI  Date initiated:: 03/05/16   Consult Status Consult Status: Complete Date: 03/05/16    Kathrin Greathouseorio, Lisle Skillman Ann 03/05/2016, 11:34 AM

## 2016-03-05 NOTE — Discharge Instructions (Signed)
Breast Pumping Tips °If you are breastfeeding, there may be times when you cannot feed your baby directly. Returning to work or going on a trip are common examples. Pumping allows you to store breast milk and feed it to your baby later.  °You may not get much milk when you first start to pump. Your breasts should start to make more after a few days. If you pump at the times you usually feed your baby, you may be able to keep making enough milk to feed your baby without also using formula. The more often you pump, the more milk you will produce.  °WHEN SHOULD I PUMP?  °· You can begin to pump soon after delivery. However, some experts recommend waiting about 4 weeks before giving your infant a bottle to make sure breastfeeding is going well.  °· If you plan to return to work, begin pumping a few weeks before. This will help you develop techniques that work best for you. It also lets you build up a supply of breast milk.   °· When you are with your infant, feed on demand and pump after each feeding.   °· When you are away from your infant for several hours, pump for about 15 minutes every 2-3 hours. Pump both breasts at the same time if you can.   °· If your infant has a formula feeding, make sure to pump around the same time.     °· If you drink any alcohol, wait 2 hours before pumping.   °HOW DO I PREPARE TO PUMP? °Your let-down reflex is the natural reaction to stimulation that makes your breast milk flow. It is easier to stimulate this reflex when you are relaxed. Find relaxation techniques that work for you. If you have difficulty with your let-down reflex, try these methods:  °· Smell one of your infant's blankets or an item of clothing.   °· Look at a picture or video of your infant.   °· Sit in a quiet, private space.   °· Massage the breast you plan to pump.   °· Place soothing warmth on the breast.   °· Play relaxing music.   °WHAT ARE SOME GENERAL BREAST PUMPING TIPS? °· Wash your hands before you pump. You  do not need to wash your nipples or breasts. °· There are three ways to pump. °¨ You can use your hand to massage and compress your breast. °¨ You can use a handheld manual pump. °¨ You can use an electric pump.   °· Make sure the suction cup (flange) on the breast pump is the right size. Place the flange directly over the nipple. If it is the wrong size or placed the wrong way, it may be painful and cause nipple damage.   °· If pumping is uncomfortable, apply a small amount of purified or modified lanolin to your nipple and areola. °· If you are using an electric pump, adjust the speed and suction power to be more comfortable. °· If pumping is painful or if you are not getting very much milk, you may need a different type of pump. A lactation consultant can help you determine what type of pump to use.   °· Keep a full water bottle near you at all times. Drinking lots of fluid helps you make more milk.  °· You can store your milk to use later. Pumped breast milk can be stored in a sealable, sterile container or plastic bag. Label all stored breast milk with the date you pumped it. °¨ Milk can stay out at room temperature for up to 8 hours. °¨   You can store your milk in the refrigerator for up to 8 days. °¨ You can store your milk in the freezer for 3 months. Thaw frozen milk using warm water. Do not put it in the microwave. °· Do not smoke. Smoking can lower your milk supply and harm your infant. If you need help quitting, ask your health care provider to recommend a program.   °WHEN SHOULD I CALL MY HEALTH CARE PROVIDER OR A LACTATION CONSULTANT? °· You are having trouble pumping. °· You are concerned that you are not making enough milk. °· You have nipple pain, soreness, or redness. °· You want to use birth control. Birth control pills may lower your milk supply. Talk to your health care provider about your options. °  °This information is not intended to replace advice given to you by your health care provider.  Make sure you discuss any questions you have with your health care provider. °  °Document Released: 05/16/2010 Document Revised: 12/01/2013 Document Reviewed: 09/18/2013 °[employer]vier Interactive Patient Education ©2016 [employer] °Postpartum Depression and Baby Blues °The postpartum period begins right after the birth of a baby. During this time, there is often a great amount of joy and excitement. It is also a time of many changes in the life of the parents. Regardless of how many times a mother gives birth, each child brings new challenges and dynamics to the family. It is not unusual to have feelings of excitement along with confusing shifts in moods, emotions, and thoughts. All mothers are at risk of developing postpartum depression or the "baby blues." These mood changes can occur right after giving birth, or they may occur many months after giving birth. The baby blues or postpartum depression can be mild or severe. Additionally, postpartum depression can go away rather quickly, or it can be a long-term condition.  °CAUSES °Raised hormone levels and the rapid drop in those levels are thought to be a main cause of postpartum depression and the baby blues. A number of hormones change during and after pregnancy. Estrogen and progesterone usually decrease right after the delivery of your baby. The levels of thyroid hormone and various cortisol steroids also rapidly drop. Other factors that play a role in these mood changes include major life events and genetics.  °RISK FACTORS °If you have any of the following risks for the baby blues or postpartum depression, know what symptoms to watch out for during the postpartum period. Risk factors that may increase the likelihood of getting the baby blues or postpartum depression include: °· Having a personal or family history of depression.   °· Having depression while being pregnant.   °· Having premenstrual mood issues or mood issues related to oral  contraceptives. °· Having a lot of life stress.   °· Having marital conflict.   °· Lacking a social support network.   °· Having a baby with special needs.   °· Having health problems, such as diabetes.   °SIGNS AND SYMPTOMS °Symptoms of baby blues include: °· Brief changes in mood, such as going from extreme happiness to sadness. °· Decreased concentration.   °· Difficulty sleeping.   °· Crying spells, tearfulness.   °· Irritability.   °· Anxiety.   °Symptoms of postpartum depression typically begin within the first month after giving birth. These symptoms include: °· Difficulty sleeping or excessive sleepiness.   °· Marked weight loss.   °· Agitation.   °· Feelings of worthlessness.   °· Lack of interest in activity or food.   °Postpartum psychosis is a very serious condition and can be dangerous. Fortunately, it is   rare. Displaying any of the following symptoms is cause for immediate medical attention. Symptoms of postpartum psychosis include:  °· Hallucinations and delusions.   °· Bizarre or disorganized behavior.   °· Confusion or disorientation.   °DIAGNOSIS  °A diagnosis is made by an evaluation of your symptoms. There are no medical or lab tests that lead to a diagnosis, but there are various questionnaires that a health care provider may use to identify those with the baby blues, postpartum depression, or psychosis. Often, a screening tool called the Edinburgh Postnatal Depression Scale is used to diagnose depression in the postpartum period.  °TREATMENT °The baby blues usually goes away on its own in 1-2 weeks. Social support is often all that is needed. You will be encouraged to get adequate sleep and rest. Occasionally, you may be given medicines to help you sleep.  °Postpartum depression requires treatment because it can last several months or longer if it is not treated. Treatment may include individual or group therapy, medicine, or both to address any social, physiological, and psychological factors  that may play a role in the depression. Regular exercise, a healthy diet, rest, and social support may also be strongly recommended.  °Postpartum psychosis is more serious and needs treatment right away. Hospitalization is often needed. °HOME CARE INSTRUCTIONS °· Get as much rest as you can. Nap when the baby sleeps.   °· Exercise regularly. Some women find yoga and walking to be beneficial.   °· Eat a balanced and nourishing diet.   °· Do little things that you enjoy. Have a cup of tea, take a bubble bath, read your favorite magazine, or listen to your favorite music. °· Avoid alcohol.   °· Ask for help with household chores, cooking, grocery shopping, or running errands as needed. Do not try to do everything.   °· Talk to people close to you about how you are feeling. Get support from your partner, family members, friends, or other new moms. °· Try to stay positive in how you think. Think about the things you are grateful for.   °· Do not spend a lot of time alone.   °· Only take over-the-counter or prescription medicine as directed by your health care provider. °· Keep all your postpartum appointments.   °· Let your health care provider know if you have any concerns.   °SEEK MEDICAL CARE IF: °You are having a reaction to or problems with your medicine. °SEEK IMMEDIATE MEDICAL CARE IF: °· You have suicidal feelings.   °· You think you may harm the baby or someone else. °MAKE SURE YOU: °· Understand these instructions. °· Will watch your condition. °· Will get help right away if you are not doing well or get worse. °  °This information is not intended to replace advice given to you by your health care provider. Make sure you discuss any questions you have with your health care provider. °  °Document Released: 08/30/2004 Document Revised: 12/01/2013 Document Reviewed: 09/07/2013 °Elsevier Interactive Patient Education ©2016 [employer] °Postpartum Care After Vaginal Delivery °After you deliver your newborn  (postpartum period), the usual stay in the hospital is 24-72 hours. If there were problems with your labor or delivery, or if you have other medical problems, you might be in the hospital longer.  °While you are in the hospital, you will receive help and instructions on how to care for yourself and your newborn during the postpartum period.  °While you are in the hospital: °· Be sure to tell your nurses if you have pain or discomfort, as well as   where you feel the pain and what makes the pain worse. °· If you had an incision made near your vagina (episiotomy) or if you had some tearing during delivery, the nurses may put ice packs on your episiotomy or tear. The ice packs may help to reduce the pain and swelling. °· If you are breastfeeding, you may feel uncomfortable contractions of your uterus for a couple of weeks. This is normal. The contractions help your uterus get back to normal size. °· It is normal to have some bleeding after delivery. °¨ For the first 1-3 days after delivery, the flow is red and the amount may be similar to a period. °¨ It is common for the flow to start and stop. °¨ In the first few days, you may pass some small clots. Let your nurses know if you begin to pass large clots or your flow increases. °¨ Do not  flush blood clots down the toilet before having the nurse look at them. °¨ During the next 3-10 days after delivery, your flow should become more watery and pink or brown-tinged in color. °¨ Ten to fourteen days after delivery, your flow should be a small amount of yellowish-white discharge. °¨ The amount of your flow will decrease over the first few weeks after delivery. Your flow may stop in 6-8 weeks. Most women have had their flow stop by 12 weeks after delivery. °· You should change your sanitary pads frequently. °· Wash your hands thoroughly with soap and water for at least 20 seconds after changing pads, using the toilet, or before holding or feeding your newborn. °· You should  feel like you need to empty your bladder within the first 6-8 hours after delivery. °· In case you become weak, lightheaded, or faint, call your nurse before you get out of bed for the first time and before you take a shower for the first time. °· Within the first few days after delivery, your breasts may begin to feel tender and full. This is called engorgement. Breast tenderness usually goes away within 48-72 hours after engorgement occurs. You may also notice milk leaking from your breasts. If you are not breastfeeding, do not stimulate your breasts. Breast stimulation can make your breasts produce more milk. °· Spending as much time as possible with your newborn is very important. During this time, you and your newborn can feel close and get to know each other. Having your newborn stay in your room (rooming in) will help to strengthen the bond with your newborn.  It will give you time to get to know your newborn and become comfortable caring for your newborn. °· Your hormones change after delivery. Sometimes the hormone changes can temporarily cause you to feel sad or tearful. These feelings should not last more than a few days. If these feelings last longer than that, you should talk to your caregiver. °· If desired, talk to your caregiver about methods of family planning or contraception. °· Talk to your caregiver about immunizations. Your caregiver may want you to have the following immunizations before leaving the hospital: °¨ Tetanus, diphtheria, and pertussis (Tdap) or tetanus and diphtheria (Td) immunization. It is very important that you and your family (including grandparents) or others caring for your newborn are up-to-date with the Tdap or Td immunizations. The Tdap or Td immunization can help protect your newborn from getting ill. °¨ Rubella immunization. °¨ Varicella (chickenpox) immunization. °¨ Influenza immunization. You should receive this annual immunization if you did not receive the    immunization during your pregnancy. °  °This information is not intended to replace advice given to you by your health care provider. Make sure you discuss any questions you have with your health care provider. °  °Document Released: 09/23/2007 Document Revised: 08/20/2012 Document Reviewed: 07/23/2012 °[employer]vier Interactive Patient Education ©2016 [employer] °Breastfeeding and Mastitis °Mastitis is inflammation of the breast tissue. It can occur in women who are breastfeeding. This can make breastfeeding painful. Mastitis will sometimes go away on its own. Your health care provider will help determine if treatment is needed. °CAUSES °Mastitis is often associated with a blocked milk (lactiferous) duct. This can happen when too much milk builds up in the breast. Causes of excess milk in the breast can include: °· Poor latch-on. If your baby is not latched onto the breast properly, she or he may not empty your breast completely while breastfeeding. °· Allowing too much time to pass between feedings. °· Wearing a bra or other clothing that is too tight. This puts extra pressure on the lactiferous ducts so milk does not flow through them as it should. °Mastitis can also be caused by a bacterial infection. Bacteria may enter the breast tissue through cuts or openings in the skin. In women who are breastfeeding, this may occur because of cracked or irritated skin. Cracks in the skin are often caused when your baby does not latch on properly to the breast. °SIGNS AND SYMPTOMS °· Swelling, redness, tenderness, and pain in an area of the breast. °· Swelling of the glands under the arm on the same side. °· Fever may or may not accompany mastitis. °If an infection is allowed to progress, a collection of pus (abscess) may develop. °DIAGNOSIS  °Your health care provider can usually diagnose mastitis based on your symptoms and a physical exam. Tests may be done to help confirm the diagnosis. These may include: °· Removal of pus  from the breast by applying pressure to the area. This pus can be examined in the lab to determine which bacteria are present. If an abscess has developed, the fluid in the abscess can be removed with a needle. This can also be used to confirm the diagnosis and determine the bacteria present. In most cases, pus will not be present. °· Blood tests to determine if your body is fighting a bacterial infection. °· Mammogram or ultrasound tests to rule out other problems or diseases. °TREATMENT  °Mastitis that occurs with breastfeeding will sometimes go away on its own. Your health care provider may choose to wait 24 hours after first seeing you to decide whether a prescription medicine is needed. If your symptoms are worse after 24 hours, your health care provider will likely prescribe an antibiotic medicine to treat the mastitis. He or she will determine which bacteria are most likely causing the infection and will then select an appropriate antibiotic medicine. This is sometimes changed based on the results of tests performed to identify the bacteria, or if there is no response to the antibiotic medicine selected. Antibiotic medicines are usually given by mouth. You may also be given medicine for pain. °HOME CARE INSTRUCTIONS °· Only take over-the-counter or prescription medicines for pain, fever, or discomfort as directed by your health care provider. °· If your health care provider prescribed an antibiotic medicine, take the medicine as directed. Make sure you finish it even if you start to feel better. °· Do not wear a tight or underwire bra. Wear a soft, supportive bra. °· Increase your fluid   intake, especially if you have a fever. °· Continue to empty the breast. Your health care provider can tell you whether this milk is safe for your infant or needs to be thrown out. You may be told to stop nursing until your health care provider thinks it is safe for your baby. Use a breast pump if you are advised to stop  nursing. °· Keep your nipples clean and dry. °· Empty the first breast completely before going to the other breast. If your baby is not emptying your breasts completely for some reason, use a breast pump to empty your breasts. °· If you go back to work, pump your breasts while at work to stay in time with your nursing schedule. °· Avoid allowing your breasts to become overly filled with milk (engorged). °SEEK MEDICAL CARE IF: °· You have pus-like discharge from the breast. °· Your symptoms do not improve with the treatment prescribed by your health care provider within 2 days. °SEEK IMMEDIATE MEDICAL CARE IF: °· Your pain and swelling are getting worse. °· You have pain that is not controlled with medicine. °· You have a red line extending from the breast toward your armpit. °· You have a fever or persistent symptoms for more than 2-3 days. °· You have a fever and your symptoms suddenly get worse. °MAKE SURE YOU:  °· Understand these instructions. °· Will watch your condition. °· Will get help right away if you are not doing well or get worse. °  °This information is not intended to replace advice given to you by your health care provider. Make sure you discuss any questions you have with your health care provider. °  °Document Released: 03/23/2005 Document Revised: 12/01/2013 Document Reviewed: 07/02/2013 °[employer]vier Interactive Patient Education ©2016 [employer] ° °Breastfeeding °Deciding to breastfeed is one of the best choices you can make for you and your baby. A change in hormones during pregnancy causes your breast tissue to grow and increases the number and size of your milk ducts. These hormones also allow proteins, sugars, and fats from your blood supply to make breast milk in your milk-producing glands. Hormones prevent breast milk from being released before your baby is born as well as prompt milk flow after birth. Once breastfeeding has begun, thoughts of your baby, as well as his or her sucking or  crying, can stimulate the release of milk from your milk-producing glands.  °BENEFITS OF BREASTFEEDING °For Your Baby °· Your first milk (colostrum) helps your baby's digestive system function better. °· There are antibodies in your milk that help your baby fight off infections. °· Your baby has a lower incidence of asthma, allergies, and sudden infant death syndrome. °· The nutrients in breast milk are better for your baby than infant formulas and are designed uniquely for your baby's needs. °· Breast milk improves your baby's brain development. °· Your baby is less likely to develop other conditions, such as childhood obesity, asthma, or type 2 diabetes mellitus. °For You °· Breastfeeding helps to create a very special bond between you and your baby. °· Breastfeeding is convenient. Breast milk is always available at the correct temperature and costs nothing. °· Breastfeeding helps to burn calories and helps you lose the weight gained during pregnancy. °· Breastfeeding makes your uterus contract to its prepregnancy size faster and slows bleeding (lochia) after you give birth.   °· Breastfeeding helps to lower your risk of developing type 2 diabetes mellitus, osteoporosis, and breast or ovarian cancer later in life. °  SIGNS THAT YOUR BABY IS HUNGRY °Early Signs of Hunger °· Increased alertness or activity. °· Stretching. °· Movement of the head from side to side. °· Movement of the head and opening of the mouth when the corner of the mouth or cheek is stroked (rooting). °· Increased sucking sounds, smacking lips, cooing, sighing, or squeaking. °· Hand-to-mouth movements. °· Increased sucking of fingers or hands. °Late Signs of Hunger °· Fussing. °· Intermittent crying. °Extreme Signs of Hunger °Signs of extreme hunger will require calming and consoling before your baby will be able to breastfeed successfully. Do not wait for the following signs of extreme hunger to occur before you initiate  breastfeeding: °· Restlessness. °· A loud, strong cry. °· Screaming. °BREASTFEEDING BASICS °Breastfeeding Initiation °· Find a comfortable place to sit or lie down, with your neck and back well supported. °· Place a pillow or rolled up blanket under your baby to bring him or her to the level of your breast (if you are seated). Nursing pillows are specially designed to help support your arms and your baby while you breastfeed. °· Make sure that your baby's abdomen is facing your abdomen. °· Gently massage your breast. With your fingertips, massage from your chest wall toward your nipple in a circular motion. This encourages milk flow. You may need to continue this action during the feeding if your milk flows slowly. °· Support your breast with 4 fingers underneath and your thumb above your nipple. Make sure your fingers are well away from your nipple and your baby's mouth. °· Stroke your baby's lips gently with your finger or nipple. °· When your baby's mouth is open wide enough, quickly bring your baby to your breast, placing your entire nipple and as much of the colored area around your nipple (areola) as possible into your baby's mouth. °¨ More areola should be visible above your baby's upper lip than below the lower lip. °¨ Your baby's tongue should be between his or her lower gum and your breast. °· Ensure that your baby's mouth is correctly positioned around your nipple (latched). Your baby's lips should create a seal on your breast and be turned out (everted). °· It is common for your baby to suck about 2-3 minutes in order to start the flow of breast milk. °Latching °Teaching your baby how to latch on to your breast properly is very important. An improper latch can cause nipple pain and decreased milk supply for you and poor weight gain in your baby. Also, if your baby is not latched onto your nipple properly, he or she may swallow some air during feeding. This can make your baby fussy. Burping your baby when  you switch breasts during the feeding can help to get rid of the air. However, teaching your baby to latch on properly is still the best way to prevent fussiness from swallowing air while breastfeeding. °Signs that your baby has successfully latched on to your nipple: °· Silent tugging or silent sucking, without causing you pain. °· Swallowing heard between every 3-4 sucks. °· Muscle movement above and in front of his or her ears while sucking. °Signs that your baby has not successfully latched on to nipple: °· Sucking sounds or smacking sounds from your baby while breastfeeding. °· Nipple pain. °If you think your baby has not latched on correctly, slip your finger into the corner of your baby's mouth to break the suction and place it between your baby's gums. Attempt breastfeeding initiation again. °Signs of Successful Breastfeeding °  Signs from your baby: °· A gradual decrease in the number of sucks or complete cessation of sucking. °· Falling asleep. °· Relaxation of his or her body. °· Retention of a small amount of milk in his or her mouth. °· Letting go of your breast by himself or herself. °Signs from you: °· Breasts that have increased in firmness, weight, and size 1-3 hours after feeding. °· Breasts that are softer immediately after breastfeeding. °· Increased milk volume, as well as a change in milk consistency and color by the fifth day of breastfeeding. °· Nipples that are not sore, cracked, or bleeding. °Signs That Your Baby is Getting Enough Milk °· Wetting at least 3 diapers in a 24-hour period. The urine should be clear and pale yellow by age 5 days. °· At least 3 stools in a 24-hour period by age 5 days. The stool should be soft and yellow. °· At least 3 stools in a 24-hour period by age 7 days. The stool should be seedy and yellow. °· No loss of weight greater than 10% of birth weight during the first 3 days of age. °· Average weight gain of 4-7 ounces (113-198 g) per week after age 4  days. °· Consistent daily weight gain by age 5 days, without weight loss after the age of 2 weeks. °After a feeding, your baby may spit up a small amount. This is common. °BREASTFEEDING FREQUENCY AND DURATION °Frequent feeding will help you make more milk and can prevent sore nipples and breast engorgement. Breastfeed when you feel the need to reduce the fullness of your breasts or when your baby shows signs of hunger. This is called "breastfeeding on demand." Avoid introducing a pacifier to your baby while you are working to establish breastfeeding (the first 4-6 weeks after your baby is born). After this time you may choose to use a pacifier. Research has shown that pacifier use during the first year of a baby's life decreases the risk of sudden infant death syndrome (SIDS). °Allow your baby to feed on each breast as long as he or she wants. Breastfeed until your baby is finished feeding. When your baby unlatches or falls asleep while feeding from the first breast, offer the second breast. Because newborns are often sleepy in the first few weeks of life, you may need to awaken your baby to get him or her to feed. °Breastfeeding times will vary from baby to baby. However, the following rules can serve as a guide to help you ensure that your baby is properly fed: °· Newborns (babies 4 weeks of age or younger) may breastfeed every 1-3 hours. °· Newborns should not go longer than 3 hours during the day or 5 hours during the night without breastfeeding. °· You should breastfeed your baby a minimum of 8 times in a 24-hour period until you begin to introduce solid foods to your baby at around 6 months of age. °BREAST MILK PUMPING °Pumping and storing breast milk allows you to ensure that your baby is exclusively fed your breast milk, even at times when you are unable to breastfeed. This is especially important if you are going back to work while you are still breastfeeding or when you are not able to be present during  feedings. Your lactation consultant can give you guidelines on how long it is safe to store breast milk. °A breast pump is a machine that allows you to pump milk from your breast into a sterile bottle. The pumped breast milk can   then be stored in a refrigerator or freezer. Some breast pumps are operated by hand, while others use electricity. Ask your lactation consultant which type will work best for you. Breast pumps can be purchased, but some hospitals and breastfeeding support groups lease breast pumps on a monthly basis. A lactation consultant can teach you how to hand express breast milk, if you prefer not to use a pump. °CARING FOR YOUR BREASTS WHILE YOU BREASTFEED °Nipples can become dry, cracked, and sore while breastfeeding. The following recommendations can help keep your breasts moisturized and healthy: °· Avoid using soap on your nipples. °· Wear a supportive bra. Although not required, special nursing bras and tank tops are designed to allow access to your breasts for breastfeeding without taking off your entire bra or top. Avoid wearing underwire-style bras or extremely tight bras. °· Air dry your nipples for 3-4 minutes after each feeding. °· Use only cotton bra pads to absorb leaked breast milk. Leaking of breast milk between feedings is normal. °· Use lanolin on your nipples after breastfeeding. Lanolin helps to maintain your skin's normal moisture barrier. If you use pure lanolin, you do not need to wash it off before feeding your baby again. Pure lanolin is not toxic to your baby. You may also hand express a few drops of breast milk and gently massage that milk into your nipples and allow the milk to air dry. °In the first few weeks after giving birth, some women experience extremely full breasts (engorgement). Engorgement can make your breasts feel heavy, warm, and tender to the touch. Engorgement peaks within 3-5 days after you give birth. The following recommendations can help ease  engorgement: °· Completely empty your breasts while breastfeeding or pumping. You may want to start by applying warm, moist heat (in the shower or with warm water-soaked hand towels) just before feeding or pumping. This increases circulation and helps the milk flow. If your baby does not completely empty your breasts while breastfeeding, pump any extra milk after he or she is finished. °· Wear a snug bra (nursing or regular) or tank top for 1-2 days to signal your body to slightly decrease milk production. °· Apply ice packs to your breasts, unless this is too uncomfortable for you. °· Make sure that your baby is latched on and positioned properly while breastfeeding. °If engorgement persists after 48 hours of following these recommendations, contact your health care provider or a lactation consultant. °OVERALL HEALTH CARE RECOMMENDATIONS WHILE BREASTFEEDING °· Eat healthy foods. Alternate between meals and snacks, eating 3 of each per day. Because what you eat affects your breast milk, some of the foods may make your baby more irritable than usual. Avoid eating these foods if you are sure that they are negatively affecting your baby. °· Drink milk, fruit juice, and water to satisfy your thirst (about 10 glasses a day). °· Rest often, relax, and continue to take your prenatal vitamins to prevent fatigue, stress, and anemia. °· Continue breast self-awareness checks. °· Avoid chewing and smoking tobacco. Chemicals from cigarettes that pass into breast milk and exposure to secondhand smoke may harm your baby. °· Avoid alcohol and drug use, including marijuana. °Some medicines that may be harmful to your baby can pass through breast milk. It is important to ask your health care provider before taking any medicine, including all over-the-counter and prescription medicine as well as vitamin and herbal supplements. °It is possible to become pregnant while breastfeeding. If birth control is desired, ask your health care    provider about options that will be safe for your baby. °SEEK MEDICAL CARE IF: °· You feel like you want to stop breastfeeding or have become frustrated with breastfeeding. °· You have painful breasts or nipples. °· Your nipples are cracked or bleeding. °· Your breasts are red, tender, or warm. °· You have a swollen area on either breast. °· You have a fever or chills. °· You have nausea or vomiting. °· You have drainage other than breast milk from your nipples. °· Your breasts do not become full before feedings by the fifth day after you give birth. °· You feel sad and depressed. °· Your baby is too sleepy to eat well. °· Your baby is having trouble sleeping.   °· Your baby is wetting less than 3 diapers in a 24-hour period. °· Your baby has less than 3 stools in a 24-hour period. °· Your baby's skin or the white part of his or her eyes becomes yellow.   °· Your baby is not gaining weight by 5 days of age. °SEEK IMMEDIATE MEDICAL CARE IF: °· Your baby is overly tired (lethargic) and does not want to wake up and feed. °· Your baby develops an unexplained fever. °  °This information is not intended to replace advice given to you by your health care provider. Make sure you discuss any questions you have with your health care provider. °  °Document Released: 11/26/2005 Document Revised: 08/17/2015 Document Reviewed: 05/20/2013 °[employer] Interactive Patient Education ©2016 [employer] ° °

## 2016-03-06 LAB — RH IG WORKUP (INCLUDES ABO/RH)
ABO/RH(D): O NEG
Fetal Screen: NEGATIVE
GESTATIONAL AGE(WKS): 39.3
Unit division: 0
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
